# Patient Record
Sex: Female | Born: 1935 | Hispanic: No | Marital: Married | Smoking: Former smoker
Health system: Southern US, Community
[De-identification: ages and names within clinical notes are randomized; demographics above are authoritative.]

## PROBLEM LIST (undated history)

## (undated) DIAGNOSIS — K219 Gastro-esophageal reflux disease without esophagitis: Secondary | ICD-10-CM

## (undated) DIAGNOSIS — I10 Essential (primary) hypertension: Secondary | ICD-10-CM

## (undated) DIAGNOSIS — N3281 Overactive bladder: Secondary | ICD-10-CM

## (undated) DIAGNOSIS — M810 Age-related osteoporosis without current pathological fracture: Secondary | ICD-10-CM

## (undated) DIAGNOSIS — R0781 Pleurodynia: Secondary | ICD-10-CM

## (undated) DIAGNOSIS — R0789 Other chest pain: Secondary | ICD-10-CM

## (undated) DIAGNOSIS — F419 Anxiety disorder, unspecified: Secondary | ICD-10-CM

## (undated) DIAGNOSIS — G2581 Restless legs syndrome: Secondary | ICD-10-CM

## (undated) DIAGNOSIS — G43909 Migraine, unspecified, not intractable, without status migrainosus: Secondary | ICD-10-CM

## (undated) DIAGNOSIS — E538 Deficiency of other specified B group vitamins: Secondary | ICD-10-CM

## (undated) DIAGNOSIS — G4730 Sleep apnea, unspecified: Secondary | ICD-10-CM

## (undated) DIAGNOSIS — M5030 Other cervical disc degeneration, unspecified cervical region: Secondary | ICD-10-CM

## (undated) DIAGNOSIS — Z888 Allergy status to other drugs, medicaments and biological substances status: Secondary | ICD-10-CM

## (undated) DIAGNOSIS — R2681 Unsteadiness on feet: Secondary | ICD-10-CM

## (undated) DIAGNOSIS — S72009A Fracture of unspecified part of neck of unspecified femur, initial encounter for closed fracture: Secondary | ICD-10-CM

## (undated) DIAGNOSIS — R002 Palpitations: Secondary | ICD-10-CM

## (undated) HISTORY — PX: OTHER SURGICAL HISTORY: SHX169

## (undated) HISTORY — PX: APPENDECTOMY: SHX54

## (undated) HISTORY — DX: Other chest pain: R07.89

## (undated) HISTORY — DX: Pleurodynia: R07.81

## (undated) HISTORY — DX: Allergy status to other drugs, medicaments and biological substances: Z88.8

## (undated) HISTORY — DX: Gastro-esophageal reflux disease without esophagitis: K21.9

## (undated) HISTORY — PX: EXPLORATORY LAPAROTOMY: SUR591

## (undated) HISTORY — DX: Palpitations: R00.2

## (undated) HISTORY — DX: Migraine, unspecified, not intractable, without status migrainosus: G43.909

## (undated) HISTORY — DX: Sleep apnea, unspecified: G47.30

---

## 2005-04-15 ENCOUNTER — Ambulatory Visit: Payer: Self-pay | Admitting: Cardiology

## 2005-04-21 ENCOUNTER — Ambulatory Visit: Payer: Self-pay | Admitting: Cardiology

## 2005-04-28 ENCOUNTER — Ambulatory Visit: Payer: Self-pay | Admitting: Cardiology

## 2005-05-13 ENCOUNTER — Ambulatory Visit: Payer: Self-pay | Admitting: Cardiology

## 2005-06-16 ENCOUNTER — Ambulatory Visit: Payer: Self-pay | Admitting: Cardiology

## 2007-08-04 ENCOUNTER — Ambulatory Visit: Payer: Self-pay | Admitting: Cardiology

## 2010-10-22 ENCOUNTER — Other Ambulatory Visit: Payer: Self-pay

## 2011-02-27 ENCOUNTER — Encounter: Payer: Self-pay | Admitting: Cardiology

## 2011-02-27 ENCOUNTER — Ambulatory Visit (INDEPENDENT_AMBULATORY_CARE_PROVIDER_SITE_OTHER): Payer: Medicare Other | Admitting: Cardiology

## 2011-02-27 VITALS — BP 131/69 | HR 76 | Ht 60.0 in | Wt 101.0 lb

## 2011-02-27 DIAGNOSIS — K219 Gastro-esophageal reflux disease without esophagitis: Secondary | ICD-10-CM | POA: Insufficient documentation

## 2011-02-27 DIAGNOSIS — R0789 Other chest pain: Secondary | ICD-10-CM

## 2011-02-27 DIAGNOSIS — R0602 Shortness of breath: Secondary | ICD-10-CM | POA: Insufficient documentation

## 2011-02-27 DIAGNOSIS — Z888 Allergy status to other drugs, medicaments and biological substances status: Secondary | ICD-10-CM | POA: Insufficient documentation

## 2011-02-27 DIAGNOSIS — R002 Palpitations: Secondary | ICD-10-CM

## 2011-02-27 NOTE — Assessment & Plan Note (Signed)
At this point I am not convinced that her spells in the nighttime represent ischemia.  I've chosen not to proceed with any type of exercise testing at this point.

## 2011-02-27 NOTE — Patient Instructions (Signed)
Follow up as scheduled. Your physician recommends that you continue on your current medications as directed. Please refer to the Current Medication list given to you today. Your physician has requested that you have an echocardiogram. Echocardiography is a painless test that uses sound waves to create images of your heart. It provides your doctor with information about the size and shape of your heart and how well your heart's chambers and valves are working. This procedure takes approximately one hour. There are no restrictions for this procedure.

## 2011-02-27 NOTE — Assessment & Plan Note (Signed)
She is not having any significant palpitations at this time. No further workup. 

## 2011-02-27 NOTE — Progress Notes (Signed)
HPI The patient is seen to establish cardiology care.  We have received some information from her primary care office.  There is some additional information possibly at the hospital in [place] that has not been released.  However I do not believe that includes any significant cardiac information.  The patient has had palpitations over years.  She also has had some exercise testing and an echo in the past by history.  We know that the exercise test in 2011 was read as normal in [place].  The patient has exertional shortness of breath.  However she tells me that she has had pulmonary function studies and she was told that they are normal.  He does not have exertional chest pain.  She has some spells that occur in the middle of the night that include chest discomfort.  In addition there is some difficulty with moving her left arm due to pain.  She gets up and this resolves. Allergies  Allergen Reactions  . Boniva (Ibandronate Sodium)   . Codeine   . Dilaudid (Hydromorphone Hcl)   . Erythromycin   . Macrodantin   . Morphine And Related   . Requip   . Sulfa Antibiotics   . Aspirin     Stomach irritation    Current Outpatient Prescriptions  Medication Sig Dispense Refill  . ACETAMINOPHEN-BUTALBITAL 50-325 MG TABS Take 1 tablet by mouth as needed.        . clonazePAM (KLONOPIN) 2 MG tablet Take 2 mg by mouth 2 (two) times daily as needed.        . Cyanocobalamin (VITAMIN B 12 PO) Take 1 tablet by mouth daily.        . diphenoxylate-atropine (LOMOTIL) 2.5-0.025 MG per tablet Take 1 tablet by mouth 4 (four) times daily as needed.        . fish oil-omega-3 fatty acids 1000 MG capsule Take 1 g by mouth daily.        . Flaxseed, Linseed, (FLAX SEED OIL) 1000 MG CAPS Take 1 capsule by mouth daily.        . Lidocaine HCl 2 % SOLN Inhale 2 mLs into the lungs as needed.        . meperidine (DEMEROL) 50 MG tablet Take 50 mg by mouth every 4 (four) hours as needed.        . pantoprazole  (PROTONIX) 40 MG tablet Take 40 mg by mouth 2 (two) times daily.        . promethazine (PHENERGAN) 25 MG tablet Take 25 mg by mouth every 4 (four) hours as needed.        . pyridOXINE (VITAMIN B-6) 100 MG tablet Take 100 mg by mouth daily.        . traMADol (ULTRAM) 50 MG tablet Take 50 mg by mouth every 6 (six) hours as needed.        Marland Kitchen VITAMIN D, CHOLECALCIFEROL, PO Take 1 capsule by mouth daily.          History   Social History  . Marital Status: Married    Spouse Name: N/A    Number of Children: N/A  . Years of Education: N/A   Occupational History  . Not on file.   Social History Main Topics  . Smoking status: Former Smoker -- 2.0 packs/day for 7 years    Types: Cigarettes    Quit date: 09/08/1964  . Smokeless tobacco: Never Used  . Alcohol Use: Not on file  . Drug Use: Not on file  .  Sexually Active: Not on file   Other Topics Concern  . Not on file   Social History Narrative  . No narrative on file    No family history on file.  Past Medical History  Diagnosis Date  . Rib pain     March, 2012, injury from a fall  . Aspirin allergy   . Palpitations   . GERD (gastroesophageal reflux disease)   . Migraines   . History of abdominal surgery     Appendectomy, hysterectomy, exploratory surgeries.  . Chest discomfort     episodes in the middle of the night waking with some chest discomfort and difficulty moving her left arm because of pain    No past surgical history on file.  ROS  Patient denies fever, chills, headache, sweats, rash, change in vision, change in hearing, cough, nausea vomiting, urinary symptoms.  All other systems are reviewed and are negative.  PHYSICAL EXAM Patient is stable today.  She is here with her granddaughter.  She is talkative.  She is oriented to person time and place.  Affect is normal.  Head is atraumatic.  There is no xanthelasma.  There no carotid bruits.  There is no jugulovenous distention.  Lungs are clear.  Respiratory  effort is not labored.  Cardiac exam reveals S1 and S2.  No clicks or significant murmurs.  The abdomen is soft.  There is no peripheral edema.  She does have kyphosis of the spine.  There are no skin rashes. Filed Vitals:   02/27/11 1308  BP: 131/69  Pulse: 76  Height: 5' (1.524 m)  Weight: 101 lb (45.813 kg)    EKG  Done today and reviewed by me.  Is normal.  ASSESSMENT & PLAN

## 2011-02-27 NOTE — Assessment & Plan Note (Signed)
Etiology of her shortness of breath is not clear.  I am not convinced that it represents ischemia.  She reports to me that her breathing studies have been normal in the past.  We will reassess her LV function I. Two-dimensional echo to be sure that there has not been a significant change.  Historically we have reason to believe that it has been normal in the past.

## 2011-02-28 ENCOUNTER — Other Ambulatory Visit: Payer: Self-pay | Admitting: *Deleted

## 2011-02-28 DIAGNOSIS — R0602 Shortness of breath: Secondary | ICD-10-CM

## 2011-02-28 DIAGNOSIS — R002 Palpitations: Secondary | ICD-10-CM

## 2011-02-28 DIAGNOSIS — R0789 Other chest pain: Secondary | ICD-10-CM

## 2011-03-13 ENCOUNTER — Other Ambulatory Visit: Payer: Self-pay | Admitting: *Deleted

## 2011-03-13 ENCOUNTER — Other Ambulatory Visit (INDEPENDENT_AMBULATORY_CARE_PROVIDER_SITE_OTHER): Payer: Medicare Other | Admitting: *Deleted

## 2011-03-13 ENCOUNTER — Other Ambulatory Visit: Payer: Self-pay | Admitting: Cardiology

## 2011-03-13 DIAGNOSIS — R0602 Shortness of breath: Secondary | ICD-10-CM

## 2011-03-13 DIAGNOSIS — I369 Nonrheumatic tricuspid valve disorder, unspecified: Secondary | ICD-10-CM

## 2011-03-13 DIAGNOSIS — R0789 Other chest pain: Secondary | ICD-10-CM

## 2011-03-13 DIAGNOSIS — R002 Palpitations: Secondary | ICD-10-CM

## 2011-03-13 DIAGNOSIS — I379 Nonrheumatic pulmonary valve disorder, unspecified: Secondary | ICD-10-CM

## 2011-03-20 ENCOUNTER — Encounter: Payer: Self-pay | Admitting: Cardiology

## 2011-03-20 DIAGNOSIS — R9430 Abnormal result of cardiovascular function study, unspecified: Secondary | ICD-10-CM | POA: Insufficient documentation

## 2011-03-21 ENCOUNTER — Ambulatory Visit (INDEPENDENT_AMBULATORY_CARE_PROVIDER_SITE_OTHER): Payer: Medicare Other | Admitting: Cardiology

## 2011-03-21 ENCOUNTER — Encounter: Payer: Self-pay | Admitting: Cardiology

## 2011-03-21 DIAGNOSIS — R002 Palpitations: Secondary | ICD-10-CM

## 2011-03-21 DIAGNOSIS — R0789 Other chest pain: Secondary | ICD-10-CM

## 2011-03-21 NOTE — Assessment & Plan Note (Signed)
At this point there is no evidence of significant arrhythmias.  No further workup.

## 2011-03-21 NOTE — Patient Instructions (Signed)
Your physician wants you to follow-up in: 6 months. You will receive a reminder letter in the mail one-two months in advance. If you don't receive a letter, please call our office to schedule the follow-up appointment. Your physician recommends that you continue on your current medications as directed. Please refer to the Current Medication list given to you today. 

## 2011-03-21 NOTE — Progress Notes (Signed)
HPI Patient is seen for followup chest pain and palpitations.  I saw her last February 27, 2011.  Decision at that time was made to reassure her and proceed with a 2-D echo.  The study was done and reveals an ejection fraction of 55%.  There no focal wall motion abnormalities.  There is no significant valvular abnormalities.  She has been stable.  Her husband was ill and she has been stressed by this. Allergies  Allergen Reactions  . Boniva (Ibandronate Sodium)   . Codeine   . Dilaudid (Hydromorphone Hcl)   . Erythromycin   . Macrodantin   . Morphine And Related   . Requip   . Sulfa Antibiotics   . Aspirin     Stomach irritation    Current Outpatient Prescriptions  Medication Sig Dispense Refill  . ACETAMINOPHEN-BUTALBITAL 50-325 MG TABS Take 1 tablet by mouth as needed.        . clonazePAM (KLONOPIN) 2 MG tablet Take 2 mg by mouth 2 (two) times daily as needed.        . Cyanocobalamin (VITAMIN B 12 PO) Take 1 tablet by mouth daily.        . diphenoxylate-atropine (LOMOTIL) 2.5-0.025 MG per tablet Take 1 tablet by mouth 4 (four) times daily as needed.        . fish oil-omega-3 fatty acids 1000 MG capsule Take 1 g by mouth daily.        . Flaxseed, Linseed, (FLAX SEED OIL) 1000 MG CAPS Take 1 capsule by mouth daily.        . Lidocaine HCl 2 % SOLN Inhale 2 mLs into the lungs as needed.        . meperidine (DEMEROL) 50 MG tablet Take 50 mg by mouth every 4 (four) hours as needed.        . pantoprazole (PROTONIX) 40 MG tablet Take 40 mg by mouth 2 (two) times daily.        . promethazine (PHENERGAN) 25 MG tablet Take 25 mg by mouth every 4 (four) hours as needed.        . pyridOXINE (VITAMIN B-6) 100 MG tablet Take 100 mg by mouth daily.        . traMADol (ULTRAM) 50 MG tablet Take 50 mg by mouth every 6 (six) hours as needed.        Marland Kitchen VITAMIN D, CHOLECALCIFEROL, PO Take 1 capsule by mouth daily.          History   Social History  . Marital Status: Married    Spouse Name: N/A    Number  of Children: N/A  . Years of Education: N/A   Occupational History  . Not on file.   Social History Main Topics  . Smoking status: Former Smoker -- 2.0 packs/day for 7 years    Types: Cigarettes    Quit date: 09/08/1964  . Smokeless tobacco: Never Used  . Alcohol Use: Not on file  . Drug Use: Not on file  . Sexually Active: Not on file   Other Topics Concern  . Not on file   Social History Narrative  . No narrative on file    No family history on file.  Past Medical History  Diagnosis Date  . Rib pain     March, 2012, injury from a fall  . Aspirin allergy   . Palpitations   . GERD (gastroesophageal reflux disease)   . Migraines   . History of abdominal surgery  Appendectomy, hysterectomy, exploratory surgeries.  . Chest discomfort     episodes in the middle of the night waking with some chest discomfort and difficulty moving her left arm because of pain  . Shortness of breath     patient reports that pulmonary function studies were good in the past.  . Ejection fraction     EF 55%,echo,03/13/2011    No past surgical history on file.  ROS  Patient denies fever, chills, headache, sweats, rash, change in vision, change in hearing, chest pain, cough, nausea vomiting, urinary symptoms.  All other systems are reviewed and are negative.  PHYSICAL EXAM Patient is stable today.  She is here with a family member.  She is anxious as always.  He is oriented to person time and place.  Affect is normal.  Head is atraumatic.  Lungs are clear.  Respiratory effort is nonlabored.  Cardiac exam reveals S1-S2.  There is a very soft systolic murmur.  The abdomen is soft.  There is no peripheral edema.   ASSESSMENT & PLAN

## 2011-03-21 NOTE — Assessment & Plan Note (Signed)
Left ventricular function is normal.  I feel the patient does not exercise testing at this time.  Reassurance.

## 2012-01-14 ENCOUNTER — Encounter: Payer: Self-pay | Admitting: Cardiology

## 2012-01-14 ENCOUNTER — Ambulatory Visit (INDEPENDENT_AMBULATORY_CARE_PROVIDER_SITE_OTHER): Payer: Medicare Other | Admitting: Cardiology

## 2012-01-14 DIAGNOSIS — G4730 Sleep apnea, unspecified: Secondary | ICD-10-CM

## 2012-01-14 DIAGNOSIS — R0789 Other chest pain: Secondary | ICD-10-CM

## 2012-01-14 DIAGNOSIS — R079 Chest pain, unspecified: Secondary | ICD-10-CM

## 2012-01-14 DIAGNOSIS — R002 Palpitations: Secondary | ICD-10-CM

## 2012-01-14 MED ORDER — NITROGLYCERIN 0.4 MG SL SUBL: 0.4000 mg | SUBLINGUAL_TABLET | SUBLINGUAL | Status: DC | PRN

## 2012-01-14 NOTE — Progress Notes (Signed)
HPI  Patient is here to followup chest pain and palpitations. She is stable. She did fall and injure her pelvis. She did not have syncope. This event occurred while ago when she is stabilized. She has some shooting chest pain at night. This does not sound cardiac in origin. She had a skin lesion removed from her scalp and is awaiting the biopsy. Also it appears that she may well have sleep apnea.  Allergies  Allergen Reactions  . Boniva (Ibandronate Sodium)   . Codeine   . Darvocet (Propoxyphene-Acetaminophen)   . Dilaudid (Hydromorphone Hcl)   . Erythromycin   . Macrodantin   . Morphine And Related   . Ropinirole Hcl   . Sulfa Antibiotics   . Aspirin     Stomach irritation    Current Outpatient Prescriptions  Medication Sig Dispense Refill  . b complex vitamins tablet Take 1 tablet by mouth daily.      . cetirizine (ZYRTEC) 10 MG tablet Take 10 mg by mouth daily.      . clonazePAM (KLONOPIN) 2 MG tablet Take 2 mg by mouth 2 (two) times daily.       . fish oil-omega-3 fatty acids 1000 MG capsule Take 1 g by mouth daily.        . Flaxseed, Linseed, (FLAX SEED OIL) 1000 MG CAPS Take 1 capsule by mouth daily.        . Lidocaine HCl 2 % SOLN Inhale 2 mLs into the lungs as needed.        . meperidine (DEMEROL) 50 MG tablet Take 50 mg by mouth every 4 (four) hours as needed.        . pantoprazole (PROTONIX) 40 MG tablet Take 40 mg by mouth 2 (two) times daily.        . promethazine (PHENERGAN) 25 MG tablet Take 25 mg by mouth every 4 (four) hours as needed.        Marland Kitchen VITAMIN D, CHOLECALCIFEROL, PO Take 1 capsule by mouth daily.          History   Social History  . Marital Status: Married    Spouse Name: N/A    Number of Children: N/A  . Years of Education: N/A   Occupational History  . Not on file.   Social History Main Topics  . Smoking status: Former Smoker -- 2.0 packs/day for 7 years    Types: Cigarettes    Quit date: 09/08/1964  . Smokeless tobacco: Never Used  .  Alcohol Use: Not on file  . Drug Use: Not on file  . Sexually Active: Not on file   Other Topics Concern  . Not on file   Social History Narrative  . No narrative on file    No family history on file.  Past Medical History  Diagnosis Date  . Rib pain     March, 2012, injury from a fall  . Aspirin allergy   . Palpitations   . GERD (gastroesophageal reflux disease)   . Migraines   . History of abdominal surgery     Appendectomy, hysterectomy, exploratory surgeries.  . Chest discomfort     episodes in the middle of the night waking with some chest discomfort and difficulty moving her left arm because of pain  . Shortness of breath     patient reports that pulmonary function studies were good in the past.  . Ejection fraction     EF 55%,echo,03/13/2011    No past surgical history on  file.  ROS  Patient denies fever, chills, headache, sweats, rash, change in vision, change in hearing, cough, nausea vomiting, urinary symptoms. All other systems are reviewed and are negative.  PHYSICAL EXAM  Patient is oriented to person time and place. Affect is normal. She is very talkative as always. There is no jugular venous distention. The area of biopsy on her scalp is healing. Lungs reveal a few scattered rhonchi. There is no respiratory distress. Cardiac exam reveals S1 and S2. There no clicks or significant murmurs. The abdomen is soft. Is no peripheral edema.  Filed Vitals:   01/14/12 1324  BP: 110/65  Pulse: 87  Height: 5\' 2"  (1.575 m)  Weight: 95 lb (43.092 kg)     ASSESSMENT & PLAN

## 2012-01-14 NOTE — Patient Instructions (Signed)
Your physician you to follow up in 1 year. You will receive a reminder letter in the mail one-two months in advance. If you don't receive a letter, please call our office to schedule the follow-up appointment. Your physician recommends that you continue on your current medications as directed. Please refer to the Current Medication list given to you today. 

## 2012-01-14 NOTE — Assessment & Plan Note (Signed)
The patient gives a history of what sounds like possible sleep apnea. She will discuss this with her primary physician.

## 2012-01-14 NOTE — Assessment & Plan Note (Signed)
She's not having any marked palpitations. No further workup.

## 2012-01-14 NOTE — Assessment & Plan Note (Signed)
Her chest discomfort continues to be in frequent and atypical. I believe this is not cardiac. No further workup.

## 2013-01-20 ENCOUNTER — Ambulatory Visit: Payer: Medicare Other | Admitting: Cardiology

## 2013-02-23 ENCOUNTER — Ambulatory Visit (INDEPENDENT_AMBULATORY_CARE_PROVIDER_SITE_OTHER): Payer: Medicare Other | Admitting: Cardiology

## 2013-02-23 ENCOUNTER — Encounter: Payer: Self-pay | Admitting: Cardiology

## 2013-02-23 VITALS — BP 128/73 | HR 80 | Ht 62.0 in | Wt 103.0 lb

## 2013-02-23 DIAGNOSIS — R0789 Other chest pain: Secondary | ICD-10-CM

## 2013-02-23 DIAGNOSIS — R0602 Shortness of breath: Secondary | ICD-10-CM

## 2013-02-23 DIAGNOSIS — R002 Palpitations: Secondary | ICD-10-CM

## 2013-02-23 NOTE — Patient Instructions (Addendum)

## 2013-02-23 NOTE — Assessment & Plan Note (Signed)
There are very rare and significant palpitations. No further workup.

## 2013-02-23 NOTE — Progress Notes (Signed)
HPI  Patient is seen in one year followup to followup her overall cardiac status. She is doing fine. She is not having any recurrent chest pain. She has very rare very limited palpitations. These are not causing her any significant problems.  Allergies  Allergen Reactions  . Boniva (Ibandronate Sodium)   . Codeine   . Darvocet (Propoxyphene-Acetaminophen)   . Dilaudid (Hydromorphone Hcl)   . Erythromycin   . Macrodantin   . Morphine And Related   . Ropinirole Hcl   . Sulfa Antibiotics   . Aspirin     Stomach irritation    Current Outpatient Prescriptions  Medication Sig Dispense Refill  . clonazePAM (KLONOPIN) 2 MG tablet Take 2 mg by mouth 2 (two) times daily.       . Cyanocobalamin (B-12 PO) Take 1 tablet by mouth daily.      . fish oil-omega-3 fatty acids 1000 MG capsule Take 1 g by mouth daily.        . Flaxseed, Linseed, (FLAX SEED OIL) 1000 MG CAPS Take 1 capsule by mouth daily.        . Lidocaine HCl 2 % SOLN Inhale 2 mLs into the lungs as needed.        . meperidine (DEMEROL) 50 MG tablet Take 50 mg by mouth every 4 (four) hours as needed.        . nitroGLYCERIN (NITROSTAT) 0.4 MG SL tablet Place 0.4 mg under the tongue every 5 (five) minutes as needed.      . pantoprazole (PROTONIX) 40 MG tablet Take 40 mg by mouth 2 (two) times daily.        . promethazine (PHENERGAN) 25 MG tablet Take 25 mg by mouth every 4 (four) hours as needed.        . Pyridoxine HCl (B-6 PO) Take 1 tablet by mouth daily.      Marland Kitchen VITAMIN D, CHOLECALCIFEROL, PO Take 1 capsule by mouth daily.         No current facility-administered medications for this visit.    History   Social History  . Marital Status: Married    Spouse Name: N/A    Number of Children: N/A  . Years of Education: N/A   Occupational History  . Not on file.   Social History Main Topics  . Smoking status: Former Smoker -- 2.00 packs/day for 7 years    Types: Cigarettes    Quit date: 09/08/1964  . Smokeless tobacco:  Never Used  . Alcohol Use: Not on file  . Drug Use: Not on file  . Sexually Active: Not on file   Other Topics Concern  . Not on file   Social History Narrative  . No narrative on file    No family history on file.  Past Medical History  Diagnosis Date  . Rib pain     March, 2012, injury from a fall  . Aspirin allergy   . Palpitations   . GERD (gastroesophageal reflux disease)   . Migraines   . History of abdominal surgery     Appendectomy, hysterectomy, exploratory surgeries.  . Chest discomfort     episodes in the middle of the night waking with some chest discomfort and difficulty moving her left arm because of pain  . Shortness of breath     patient reports that pulmonary function studies were good in the past.  . Ejection fraction     EF 55%,echo,03/13/2011  . Sleep apnea     History  consistent with sleep apnea. The patient will discuss with her primary physician.    No past surgical history on file.  Patient Active Problem List   Diagnosis Date Noted  . Sleep apnea   . Ejection fraction   . Aspirin allergy   . Palpitations   . GERD (gastroesophageal reflux disease)   . Chest discomfort   . Shortness of breath     ROS   Patient denies fever, chills, headache, sweats, rash, change in vision, change in hearing, chest pain, cough, nausea vomiting, urinary symptoms. All other systems are reviewed and are negative.  PHYSICAL EXAM   Patient is oriented to person time and place. Affect is normal. She's here with her husband. There is no jugulovenous distention. Lungs are clear. Respiratory effort is nonlabored. Cardiac exam reveals S1 and S2. There no clicks or significant murmurs. The abdomen is soft. There is no peripheral edema.  Filed Vitals:   02/23/13 1304  BP: 128/73  Pulse: 80  Height: 5\' 2"  (1.575 m)  Weight: 103 lb (46.72 kg)   EKG is done today and reviewed by me. There is normal sinus rhythm. There are nonspecific ST-T wave changes. There is no  significant change.  ASSESSMENT & PLAN

## 2013-02-23 NOTE — Assessment & Plan Note (Signed)
She's not having any significant chest pain. No further workup. 

## 2013-02-23 NOTE — Assessment & Plan Note (Signed)
No significant shortness of breath. No further workup. 

## 2014-02-10 ENCOUNTER — Encounter: Payer: Self-pay | Admitting: Cardiology

## 2014-02-10 ENCOUNTER — Ambulatory Visit (INDEPENDENT_AMBULATORY_CARE_PROVIDER_SITE_OTHER): Payer: Medicare Other | Admitting: Cardiology

## 2014-02-10 VITALS — BP 132/92 | HR 69 | Ht 60.0 in | Wt 107.8 lb

## 2014-02-10 DIAGNOSIS — R002 Palpitations: Secondary | ICD-10-CM

## 2014-02-10 DIAGNOSIS — R0789 Other chest pain: Secondary | ICD-10-CM

## 2014-02-10 MED ORDER — NITROGLYCERIN 0.3 MG SL SUBL: 0.3000 mg | SUBLINGUAL_TABLET | SUBLINGUAL | Status: DC | PRN

## 2014-02-10 NOTE — Patient Instructions (Signed)
   Nitroglycerin 0.3mg  tablet - place under tongue as needed for severe chest pain only - new sent to pharm Continue all other medications.   Your physician wants you to follow up in:  1 year.  You will receive a reminder letter in the mail one-two months in advance.  If you don't receive a letter, please call our office to schedule the follow up appointment

## 2014-02-10 NOTE — Assessment & Plan Note (Signed)
She has rare vague discomfort under her left breast. She does not need further workup. I have given her permission to use a nitroglycerin if she feels she needs to on a very limited basis. She knows she should be sitting or lying if she takes the pill.

## 2014-02-10 NOTE — Assessment & Plan Note (Signed)
She has not been having any significant palpitations. No further workup. 

## 2014-02-10 NOTE — Progress Notes (Signed)
Patient ID: Crystal Lopez, female   DOB: [birth year], 79 y.o.   MRN: [id number]    HPI  Patient is seen for a one-year followup. She has a history of some palpitations. She does not have any documented significant cardiac disease. She feels well. She is remaining active. She describes a sensation of needing to take a deep breath sometimes when she stands up. This seems to not represent any significant problem. She also has some vague discomfort under her left breast that is quite rare. She might take a nitroglycerin for this once or twice per year. This seems to make her feel comfortable to follow this approach. I made it clear to her that she must be sitting or lying if she uses the nitroglycerin.  Allergies  Allergen Reactions  . Boniva [Ibandronate Sodium]   . Codeine   . Darvocet [Propoxyphene N-Acetaminophen]   . Dilaudid [Hydromorphone Hcl]   . Erythromycin   . Macrodantin   . Morphine And Related   . Ropinirole Hcl   . Sulfa Antibiotics   . Aspirin     Stomach irritation    Current Outpatient Prescriptions  Medication Sig Dispense Refill  . clonazePAM (KLONOPIN) 2 MG tablet Take 2 mg by mouth 2 (two) times daily.       . Cyanocobalamin (B-12 PO) Take 1 tablet by mouth daily.      . fish oil-omega-3 fatty acids 1000 MG capsule Take 1 g by mouth daily.        . Flaxseed, Linseed, (FLAX SEED OIL) 1000 MG CAPS Take 1 capsule by mouth daily.        . Lidocaine HCl 2 % SOLN Inhale 2 mLs into the lungs as needed.        . meperidine (DEMEROL) 50 MG tablet Take 50 mg by mouth every 4 (four) hours as needed.        . nitroGLYCERIN (NITROSTAT) 0.4 MG SL tablet Place 0.4 mg under the tongue every 5 (five) minutes as needed.      . pantoprazole (PROTONIX) 40 MG tablet Take 40 mg by mouth 2 (two) times daily.        . promethazine (PHENERGAN) 25 MG tablet Take 25 mg by mouth every 4 (four) hours as needed.        . Pyridoxine HCl (B-6 PO) Take 1 tablet by mouth daily.      Marland Kitchen VITAMIN D,  CHOLECALCIFEROL, PO Take 1 capsule by mouth daily.         No current facility-administered medications for this visit.    History   Social History  . Marital Status: Married    Spouse Name: N/A    Number of Children: N/A  . Years of Education: N/A   Occupational History  . Not on file.   Social History Main Topics  . Smoking status: Former Smoker -- 2.00 packs/day for 7 years    Types: Cigarettes    Quit date: 09/08/1964  . Smokeless tobacco: Never Used  . Alcohol Use: Not on file  . Drug Use: Not on file  . Sexual Activity: Not on file   Other Topics Concern  . Not on file   Social History Narrative  . No narrative on file    No family history on file.  Past Medical History  Diagnosis Date  . Rib pain     March, 2012, injury from a fall  . Aspirin allergy   . Palpitations   . GERD (gastroesophageal reflux disease)   .  Migraines   . History of abdominal surgery     Appendectomy, hysterectomy, exploratory surgeries.  . Chest discomfort     episodes in the middle of the night waking with some chest discomfort and difficulty moving her left arm because of pain  . Shortness of breath     patient reports that pulmonary function studies were good in the past.  . Ejection fraction     EF 55%,echo,03/13/2011  . Sleep apnea     History consistent with sleep apnea. The patient will discuss with her primary physician.    History reviewed. No pertinent past surgical history.  Patient Active Problem List   Diagnosis Date Noted  . Sleep apnea   . Ejection fraction   . Aspirin allergy   . Palpitations   . GERD (gastroesophageal reflux disease)   . Chest discomfort   . Shortness of breath     ROS   Patient denies fever, chills, headache, sweats, rash. She has had some difficulty with her eyes. She may need surgery. She's had no change in hearing. She denies cough, urinary symptoms or GI symptoms. All other systems are reviewed and are negative.  PHYSICAL EXAM   Patient is oriented to person time and place. She is here with her husband of 61 years. Head is atraumatic. Sclera and conjunctiva are normal. There is no jugulovenous distention. Lungs are clear. Respiratory effort is nonlabored. Cardiac exam reveals S1 and S2. There no clicks or significant murmurs. The abdomen is soft. There is no peripheral edema. There no musculoskeletal deformities. There are no skin rashes.  Filed Vitals:   02/10/14 0944  BP: 132/92  Pulse: 69  Height: 5' (1.524 m)  Weight: 107 lb 12.8 oz (48.898 kg)   EKG is done today and reviewed by me. There is normal sinus rhythm. There are mild nonspecific ST changes. There is no change from the past. ASSESSMENT & PLAN

## 2014-09-04 ENCOUNTER — Telehealth: Payer: Self-pay | Admitting: Cardiology

## 2014-09-04 NOTE — Telephone Encounter (Signed)
Patient called wanting to know if she needs to be seen for a recent fall and feeling not right with heart she stated in message that she left on voice mail.  Said that she would like to be seen by Dr Diona BrownerMcDowell if possible since Dr Myrtis SerKatz is retiring.  [phone]

## 2014-09-05 ENCOUNTER — Ambulatory Visit (INDEPENDENT_AMBULATORY_CARE_PROVIDER_SITE_OTHER): Payer: Medicare Other | Admitting: Internal Medicine

## 2014-09-05 ENCOUNTER — Encounter: Payer: Self-pay | Admitting: Internal Medicine

## 2014-09-05 VITALS — BP 138/76 | HR 80 | Ht 60.0 in | Wt 102.6 lb

## 2014-09-05 DIAGNOSIS — R0789 Other chest pain: Secondary | ICD-10-CM

## 2014-09-05 LAB — CBC
HCT: 30.6 % — ABNORMAL LOW (ref 36.0–46.0)
HEMOGLOBIN: 10.4 g/dL — AB (ref 12.0–15.0)
MCH: 27.1 pg (ref 26.0–34.0)
MCHC: 34 g/dL (ref 30.0–36.0)
MCV: 79.7 fL (ref 78.0–100.0)
MPV: 9.8 fL (ref 8.6–12.4)
PLATELETS: 279 10*3/uL (ref 150–400)
RBC: 3.84 MIL/uL — AB (ref 3.87–5.11)
RDW: 13.7 % (ref 11.5–15.5)
WBC: 5.9 10*3/uL (ref 4.0–10.5)

## 2014-09-05 LAB — BASIC METABOLIC PANEL
BUN: 5 mg/dL — AB (ref 6–23)
CO2: 33 mEq/L — ABNORMAL HIGH (ref 19–32)
CREATININE: 0.88 mg/dL (ref 0.50–1.10)
Calcium: 9.6 mg/dL (ref 8.4–10.5)
Chloride: 100 mEq/L (ref 96–112)
Glucose, Bld: 102 mg/dL — ABNORMAL HIGH (ref 70–99)
Potassium: 4.1 mEq/L (ref 3.5–5.3)
Sodium: 137 mEq/L (ref 135–145)

## 2014-09-05 NOTE — Progress Notes (Signed)
HPI Patinet is a 79 yo who presents for evaluation of CP The patinet usually follows up with Lovena NeighboursJ Katz  She was seen earlier this year.  She has a history of palpititions as well as CP in the past The patient fell about 3 t o4 wk ago.  No defiinite fx  Waiting on approval for MIR Since then has had episodes of epigastric and L sided CP  Pain starts in epigastrum goes L along ribs to side and up to shoulder.  Does note some nausea  Can relieve with burp.  Has occurred every night this week  No SOB  Not very active since fall.  NOt aware of how breathing would effect  Did not try that.   Overall spells last about 4 min.    Allergies  Allergen Reactions  . Boniva [Ibandronate Sodium]   . Codeine   . Darvocet [Propoxyphene N-Acetaminophen]   . Dilaudid [Hydromorphone Hcl]   . Erythromycin   . Macrodantin   . Morphine And Related   . Ropinirole Hcl   . Sulfa Antibiotics   . Aspirin     Stomach irritation    Current Outpatient Prescriptions  Medication Sig Dispense Refill  . clonazePAM (KLONOPIN) 2 MG tablet Take 2 mg by mouth 2 (two) times daily.     . Cyanocobalamin (B-12 PO) Take 1 tablet by mouth daily.    . fish oil-omega-3 fatty acids 1000 MG capsule Take 1 g by mouth daily.      . Flaxseed, Linseed, (FLAX SEED OIL) 1000 MG CAPS Take 1 capsule by mouth daily.      . Lidocaine HCl 2 % SOLN Inhale 2 mLs into the lungs as needed.      . meperidine (DEMEROL) 50 MG tablet Take 50 mg by mouth every 4 (four) hours as needed.      . nitroGLYCERIN (NITROSTAT) 0.3 MG SL tablet Place 1 tablet (0.3 mg total) under the tongue every 5 (five) minutes as needed for chest pain. 25 tablet 3  . pantoprazole (PROTONIX) 40 MG tablet Take 40 mg by mouth 2 (two) times daily.      . promethazine (PHENERGAN) 25 MG tablet Take 25 mg by mouth every 4 (four) hours as needed.      . Pyridoxine HCl (B-6 PO) Take 1 tablet by mouth daily.    Marland Kitchen. VITAMIN D, CHOLECALCIFEROL, PO Take 1 capsule by mouth daily.       No  current facility-administered medications for this visit.    Past Medical History  Diagnosis Date  . Rib pain     March, 2012, injury from a fall  . Aspirin allergy   . Palpitations   . GERD (gastroesophageal reflux disease)   . Migraines   . History of abdominal surgery     Appendectomy, hysterectomy, exploratory surgeries.  . Chest discomfort     episodes in the middle of the night waking with some chest discomfort and difficulty moving her left arm because of pain  . Shortness of breath     patient reports that pulmonary function studies were good in the past.  . Ejection fraction     EF 55%,echo,03/13/2011  . Sleep apnea     History consistent with sleep apnea. The patient will discuss with her primary physician.    No past surgical history on file.  No family history on file.  History   Social History  . Marital Status: Married    Spouse Name: N/A  Number of Children: N/A  . Years of Education: N/A   Occupational History  . Not on file.   Social History Main Topics  . Smoking status: Former Smoker -- 2.00 packs/day for 7 years    Types: Cigarettes    Quit date: 09/08/1964  . Smokeless tobacco: Never Used  . Alcohol Use: Not on file  . Drug Use: Not on file  . Sexual Activity: Not on file   Other Topics Concern  . Not on file   Social History Narrative    Review of Systems:  All systems reviewed.  They are negative to the above problem except as previously stated.  Vital Signs: BP 138/76 mmHg  Pulse 80  Ht 5' (1.524 m)  Wt 102 lb 9.6 oz (46.539 kg)  BMI 20.04 kg/m2  Physical Exam  HEENT:  Normocephalic, atraumatic. EOMI, PERRLA.  Neck: JVP is normal.  No bruits.  Lungs: clear to auscultation. No rales no wheezes.  Heart: Regular rate and rhythm. Normal S1, S2. No S3.   No significant murmurs. PMI not displaced. Chest:  Tender along lower ribs on L side and in epigastrum    Abdomen:  Supple, nontender. Normal bowel sounds. No masses. No  hepatomegaly.  Extremities:   Good distal pulses throughout. No lower extremity edema.  Musculoskeletal :moving all extremities.  Neuro:   alert and oriented x3.  CN II-XII grossly intact.  EKG  SR 78 bpm   Assessment and Plan:  1.  CP  Atypical for cardiac  Appears more musculoskeletal  I have tried t oreassure her  She is worried because family memebers had CAD Would continue current regimen  Needs time/rest.   F/U with Lovena NeighboursJ Katz  2.  Palpiations.  Has intermittently.  Follow

## 2014-09-05 NOTE — Patient Instructions (Signed)
Your physician recommends that you schedule a follow-up appointment in: 2 months with Dr. Myrtis SerKatz  Your physician recommends that you continue on your current medications as directed. Please refer to the Current Medication list given to you today.  Your physician recommends that you return for lab work  CBC/ BMP  Thank you for choosing [facility]!

## 2014-09-05 NOTE — Telephone Encounter (Addendum)
Spoke with patient yesterday regarding issue below.  Stated that she fell approximately 3-4 weeks ago.  Fell on left side & buttock.  Has seen her PMD and had x-rays.  Physical therapy saw her yesterday as well.  I spoke with therapist.  Therapist stated that patient was having a lot of pain related to her hip & could not really get comfortable.  BP - 128/75  88  97%   Patient stated that she now has had chest pain/pressure x last 4-5 nights.  Seems to happen mostly at night when she is in bed.  Pain comes up around chest area, around to back side & into left arm.  Stated that she does have palpitations and irregular heart beat.   States that her pain is very hard to describe.    Office visit scheduled for today at 1:20 with Dr. Tenny Crawoss in [place].    Patient notified and verbalized understanding.

## 2014-09-27 ENCOUNTER — Telehealth: Payer: Self-pay | Admitting: Cardiology

## 2014-09-27 NOTE — Telephone Encounter (Signed)
Lab work requested and awaiting results to be faxed to the office.

## 2014-09-27 NOTE — Telephone Encounter (Signed)
Notes Recorded by Pricilla RifflePaula Ross V, MD on 09/07/2014 at 3:07 PM Electrolytes OK Patient is mildly anemic Forward to [place] office Don't have others to compare. No new recommendations.

## 2014-09-27 NOTE — Telephone Encounter (Signed)
Nurse advised patient that [facility] lab would be contacted for results.

## 2014-09-27 NOTE — Telephone Encounter (Signed)
Wanting to results from recent lab work

## 2014-09-27 NOTE — Telephone Encounter (Signed)
Patient informed. 

## 2014-11-09 ENCOUNTER — Ambulatory Visit (INDEPENDENT_AMBULATORY_CARE_PROVIDER_SITE_OTHER): Payer: Medicare Other | Admitting: Cardiology

## 2014-11-09 ENCOUNTER — Encounter: Payer: Self-pay | Admitting: Cardiology

## 2014-11-09 VITALS — BP 147/73 | HR 74 | Ht 60.0 in | Wt 107.0 lb

## 2014-11-09 DIAGNOSIS — R002 Palpitations: Secondary | ICD-10-CM

## 2014-11-09 DIAGNOSIS — R0789 Other chest pain: Secondary | ICD-10-CM

## 2014-11-09 NOTE — Progress Notes (Signed)
Cardiology Office Note   Date:  11/09/2014   ID:  Britta Louth, DOB 10/11/1934, MRN [id number]  PCP:  Bedelia Person, MD  Cardiologist:  Willa Rough, MD   Chief Complaint  Patient presents with  . Appointment    Follow-up chest pain      History of Present Illness: Crystal Lopez is a 80 y.o. female who presents today to follow-up some chest pain and palpitations. I had seen her last in June, 2015. In December of 2015 she fell out of bed. She was bothered by some chest pain and she was seen by Dr. Tenny Craw and the [place] office. It was felt that her cardiac status was stable and that she could follow-up with me. Today she is stable and she's not having any significant chest pain. She has very rare palpitations.    Past Medical History  Diagnosis Date  . Rib pain     March, 2012, injury from a fall  . Aspirin allergy   . Palpitations   . GERD (gastroesophageal reflux disease)   . Migraines   . History of abdominal surgery     Appendectomy, hysterectomy, exploratory surgeries.  . Chest discomfort     episodes in the middle of the night waking with some chest discomfort and difficulty moving her left arm because of pain  . Shortness of breath     patient reports that pulmonary function studies were good in the past.  . Ejection fraction     EF 55%,echo,03/13/2011  . Sleep apnea     History consistent with sleep apnea. The patient will discuss with her primary physician.    History reviewed. No pertinent past surgical history.  Patient Active Problem List   Diagnosis Date Noted  . Sleep apnea   . Ejection fraction   . Aspirin allergy   . Palpitations   . GERD (gastroesophageal reflux disease)   . Chest discomfort   . Shortness of breath       Current Outpatient Prescriptions  Medication Sig Dispense Refill  . cetirizine (ZYRTEC) 10 MG tablet Take 10 mg by mouth daily.    . clonazePAM (KLONOPIN) 2 MG tablet Take 2 mg by mouth 2 (two) times daily.     .  Cyanocobalamin (B-12 PO) Take 1 tablet by mouth daily.    . fish oil-omega-3 fatty acids 1000 MG capsule Take 1 g by mouth daily.      . Flaxseed, Linseed, (FLAX SEED OIL) 1000 MG CAPS Take 1 capsule by mouth daily.      . Lidocaine HCl 2 % SOLN Inhale 2 mLs into the lungs as needed.      . meperidine (DEMEROL) 50 MG tablet Take 50 mg by mouth every 4 (four) hours as needed.      . nitroGLYCERIN (NITROSTAT) 0.3 MG SL tablet Place 1 tablet (0.3 mg total) under the tongue every 5 (five) minutes as needed for chest pain. 25 tablet 3  . pantoprazole (PROTONIX) 40 MG tablet Take 40 mg by mouth 2 (two) times daily.      . promethazine (PHENERGAN) 25 MG tablet Take 25 mg by mouth every 4 (four) hours as needed.      Marland Kitchen VITAMIN D, CHOLECALCIFEROL, PO Take 1 capsule by mouth daily.       No current facility-administered medications for this visit.    Allergies:   Boniva; Codeine; Darvocet; Dilaudid; Erythromycin; Macrodantin; Morphine and related; Ropinirole hcl; Sulfa antibiotics; and Aspirin    Social History:  The patient  reports that she quit smoking about 50 years ago. Her smoking use included Cigarettes. She has a 14 pack-year smoking history. She has never used smokeless tobacco.   Family History:  There is no significant family history of coronary disease.   ROS:  Please see the history of present illness.     Patient denies fever, chills, headache, sweats, rash, change in vision, change in hearing, chest pain, cough, nausea or vomiting, urinary symptoms. All other systems are reviewed and are negative.  PHYSICAL EXAM: VS:  BP 147/73 mmHg  Pulse 74  Ht 5' (1.524 m)  Wt 107 lb (48.535 kg)  BMI 20.90 kg/m2  SpO2 97% , Patient is oriented to person time and place. Affect is normal. She is here with a family member. Head is atraumatic. Sclera and conjunctiva are normal. There is no jugulovenous distention. Lungs are clear. Respiratory effort is not labored. Cardiac exam reveals S1 and S2.  The rhythm is regular. Abdomen is soft and there is no peripheral edema. There are no musculoskeletal deformities. There are no skin rashes.  EKG:   EKG is not done today.   Recent Labs: 09/05/2014: BUN 5*; Creatinine 0.88; Hemoglobin 10.4*; Platelets 279; Potassium 4.1; Sodium 137    Lipid Panel No results found for: CHOL, TRIG, HDL, CHOLHDL, VLDL, LDLCALC, LDLDIRECT    Wt Readings from Last 3 Encounters:  11/09/14 107 lb (48.535 kg)  09/05/14 102 lb 9.6 oz (46.539 kg)  02/10/14 107 lb 12.8 oz (48.898 kg)      Current medicines are reviewed  Patient understands her medications.     ASSESSMENT AND PLAN:

## 2014-11-09 NOTE — Assessment & Plan Note (Signed)
She has very rare palpitations. She does not have significant arrhythmias. No further workup.

## 2014-11-09 NOTE — Patient Instructions (Signed)

## 2014-11-09 NOTE — Assessment & Plan Note (Signed)
She is not having any significant recurrent chest discomfort. No further workup.

## 2015-02-09 ENCOUNTER — Ambulatory Visit (INDEPENDENT_AMBULATORY_CARE_PROVIDER_SITE_OTHER): Payer: Medicare Other | Admitting: Cardiology

## 2015-02-09 ENCOUNTER — Encounter: Payer: Self-pay | Admitting: Cardiology

## 2015-02-09 VITALS — BP 122/68 | HR 81 | Ht 60.0 in | Wt 105.0 lb

## 2015-02-09 DIAGNOSIS — M79605 Pain in left leg: Secondary | ICD-10-CM | POA: Diagnosis not present

## 2015-02-09 DIAGNOSIS — R0602 Shortness of breath: Secondary | ICD-10-CM | POA: Diagnosis not present

## 2015-02-09 DIAGNOSIS — M79604 Pain in right leg: Secondary | ICD-10-CM | POA: Diagnosis not present

## 2015-02-09 DIAGNOSIS — R002 Palpitations: Secondary | ICD-10-CM

## 2015-02-09 NOTE — Progress Notes (Signed)
Cardiology Office Note  Date: 02/09/2015   ID: Crystal Lopez, DOB 1935-04-12, MRN [id number]  PCP: Bedelia PersonAARON,CAREN T, MD  Primary Cardiologist: Nona DellSamuel McDowell, MD   Chief Complaint  Patient presents with  . Shortness of Breath  . Leg discomfort    History of Present Illness: Crystal Lopez is a 80 y.o. female presenting to the office for a follow-up visit, a former long-term patient of Dr. Myrtis SerKatz now establishing with me. She last saw Dr. Myrtis SerKatz in March. This is our first meeting. I reviewed her history which includes recurring chest pain and palpitations for several years.  Previous cardiac workup [facility] which was negative for ischemia with LVEF 70%. More recently echocardiogram in July 2012 revealed mild basal septal hypertrophy with LVEF 55%, no regional wall motion abnormalities, grade 1 diastolic dysfunction, trivial aortic regurgitation and mitral regurgitation, mild left atrial enlargement, mild tricuspid regurgitation, no pericardial effusion.  She comes in today with her husband stating that she has been having significant problems with restless legs, both at nighttime and during the daytime over the last several weeks. She is on clonazepam per Dr. Clifton CustardAaron. She also reports having bilateral leg pain, left worse than right, which she thinks may be more of a problem with a "blockage." She does not describe distinct claudication however.  Unrelated to the above symptoms, she reports being short of breath with activity over the last several months associated with intermittent atypical chest pain. ECG done today is normal.   Past Medical History  Diagnosis Date  . Rib pain     March, 2012, injury from a fall  . Aspirin allergy   . Palpitations   . GERD (gastroesophageal reflux disease)   . Migraines   . Chest discomfort     Previous negative ischemic workup  . Sleep apnea     Suspected diagnosis    Past Surgical History    Procedure Laterality Date  . Appendectomy    . Hysterectomy    . Exploratory laparotomy      Current Outpatient Prescriptions  Medication Sig Dispense Refill  . cetirizine (ZYRTEC) 10 MG tablet Take 10 mg by mouth daily.    . clonazePAM (KLONOPIN) 2 MG tablet Take 2 mg by mouth 2 (two) times daily.     . Cyanocobalamin (B-12 PO) Take 1 tablet by mouth daily.    . Lidocaine HCl 2 % SOLN Inhale 2 mLs into the lungs as needed.      . meperidine (DEMEROL) 50 MG tablet Take 50 mg by mouth every 4 (four) hours as needed.      . nitroGLYCERIN (NITROSTAT) 0.3 MG SL tablet Place 1 tablet (0.3 mg total) under the tongue every 5 (five) minutes as needed for chest pain. 25 tablet 3  . pantoprazole (PROTONIX) 40 MG tablet Take 40 mg by mouth 2 (two) times daily.      . promethazine (PHENERGAN) 25 MG tablet Take 25 mg by mouth every 4 (four) hours as needed.      Marland Kitchen. VITAMIN D, CHOLECALCIFEROL, PO Take 1 capsule by mouth daily.       No current facility-administered medications for this visit.    Allergies:  Boniva; Codeine; Darvocet; Dilaudid; Erythromycin; Macrodantin; Morphine and related; Ropinirole hcl; Sulfa antibiotics; and Aspirin   Social History: The patient  reports that she quit smoking about 50 years ago. Her smoking use included Cigarettes. She has a 14 pack-year smoking history. She has never  used smokeless tobacco. She reports that she does not drink alcohol or use illicit drugs.   ROS:  Please see the history of present illness. Otherwise, complete review of systems is positive for jumping is in her legs, bilateral leg pain, anxiety, intermittent palpitations, atypical sharp chest pains, shortness of breath..  All other systems are reviewed and negative.   Physical Exam: VS:  BP 122/68 mmHg  Pulse 81  Ht 5' (1.524 m)  Wt 105 lb (47.628 kg)  BMI 20.51 kg/m2  SpO2 98%, BMI Body mass index is 20.51 kg/(m^2).  Wt Readings from Last 3 Encounters:  02/09/15 105 lb (47.628 kg)   11/09/14 107 lb (48.535 kg)  09/05/14 102 lb 9.6 oz (46.539 kg)     General: Thin woman, appears comfortable at rest. HEENT: Conjunctiva and lids normal, oropharynx clear. Neck: Supple, no elevated JVP or carotid bruits, no thyromegaly. Lungs: Clear to auscultation, nonlabored breathing at rest. Cardiac: Regular rate and rhythm, no S3 or significant systolic murmur, no pericardial rub. Abdomen: Soft, nontender, bowel sounds present. Extremities: No pitting edema, distal pulses 1-2+. Skin: Warm and dry. Musculoskeletal: Mild kyphosis. Neuropsychiatric: Alert and oriented x3, affect grossly appropriate but seems anxious.   ECG: ECG is ordered today and reviewed showing normal sinus rhythm.    Recent Labwork: 09/05/2014: BUN 5*; Creatinine 0.88; Hemoglobin 10.4*; Platelets 279; Potassium 4.1; Sodium 137    Assessment and Plan:  1. Bilateral leg pain, not typical for claudication based on description, and complicated by what sounds to be associated restless leg syndrome which is managed by Dr. Clifton Custard. Distal pulses are 1-2+, we will obtain lower extremity arterial Dopplers to exclude significant obstructive arterial disease.   2. Exertional shortness of breath, sounds to be fairly long-standing based on patient description and associated with atypical chest discomfort. Prior ischemic workup was reassuring, ECG is normal today. We will obtain an echocardiogram to reevaluate cardiac structure and function.   Current medicines were reviewed with the patient today.   Orders Placed This Encounter  Procedures  . EKG 12-Lead  . ECHOCARDIOGRAM COMPLETE    Disposition: FU with me in 6 months.   Signed, Jonelle Sidle, MD, [facility] 02/09/2015 12:37 PM    [facility] at [facility] [address] [place], [place], [place] [address] Phone: [phone]; Fax: [phone]

## 2015-02-09 NOTE — Patient Instructions (Signed)
Your physician recommends that you continue on your current medications as directed. Please refer to the Current Medication list given to you today. Your physician has requested that you have a lower extremity arterial exercise duplex with ABI's. During this test, exercise and ultrasound are used to evaluate arterial blood flow in the legs. Allow one hour for this exam. There are no restrictions or special instructions. Your physician has requested that you have an echocardiogram. Echocardiography is a painless test that uses sound waves to create images of your heart. It provides your doctor with information about the size and shape of your heart and how well your heart's chambers and valves are working. This procedure takes approximately one hour. There are no restrictions for this procedure. Your physician recommends that you schedule a follow-up appointment in: 6 months. You will receive a reminder letter in the mail in about 4 months reminding you to call and schedule your appointment. If you don't receive this letter, please contact our office. We will call you with your results.

## 2015-02-21 ENCOUNTER — Ambulatory Visit (INDEPENDENT_AMBULATORY_CARE_PROVIDER_SITE_OTHER): Payer: Medicare Other

## 2015-02-21 ENCOUNTER — Other Ambulatory Visit: Payer: Self-pay

## 2015-02-21 DIAGNOSIS — R0602 Shortness of breath: Secondary | ICD-10-CM

## 2015-02-21 DIAGNOSIS — M79604 Pain in right leg: Secondary | ICD-10-CM | POA: Diagnosis not present

## 2015-02-21 DIAGNOSIS — M79605 Pain in left leg: Secondary | ICD-10-CM | POA: Diagnosis not present

## 2015-02-22 ENCOUNTER — Encounter: Payer: Self-pay | Admitting: *Deleted

## 2015-02-22 ENCOUNTER — Telehealth: Payer: Self-pay | Admitting: *Deleted

## 2015-02-22 NOTE — Telephone Encounter (Signed)
-----   Message from Jonelle Sidle, MD sent at 02/22/2015  8:44 AM EDT ----- Reviewed. Please let her know that the study does not indicate any significant blockages in her leg arteries.

## 2015-02-22 NOTE — Telephone Encounter (Signed)
-----   Message from Jonelle Sidle, MD sent at 02/21/2015  4:47 PM EDT ----- Reviewed report. Please let patient know that her LV function is still normal, ejection fraction 60%. Trivial aortic regurgitation is of no major clinical significance. We will follow-up on the lower extremity arterial studies when available.

## 2015-02-22 NOTE — Telephone Encounter (Signed)
Husband informed

## 2015-07-31 ENCOUNTER — Ambulatory Visit (INDEPENDENT_AMBULATORY_CARE_PROVIDER_SITE_OTHER): Payer: Medicare Other | Admitting: Cardiology

## 2015-07-31 ENCOUNTER — Encounter: Payer: Self-pay | Admitting: Cardiology

## 2015-07-31 VITALS — BP 142/82 | HR 82 | Ht 60.0 in | Wt 105.0 lb

## 2015-07-31 DIAGNOSIS — M79604 Pain in right leg: Secondary | ICD-10-CM | POA: Diagnosis not present

## 2015-07-31 DIAGNOSIS — M79605 Pain in left leg: Secondary | ICD-10-CM

## 2015-07-31 DIAGNOSIS — R0602 Shortness of breath: Secondary | ICD-10-CM | POA: Diagnosis not present

## 2015-07-31 NOTE — Progress Notes (Signed)
Cardiology Office Note  Date: 07/31/2015   ID: Crystal Lopez, DOB [birth year], MRN [id number]  PCP: Joseph Art, MD  Primary Cardiologist: Rozann Lesches, MD   Chief Complaint  Patient presents with  . Cardiac follow-up    History of Present Illness: Crystal Lopez is a 80 y.o. female that I saw for the first time back in June of this year, a former patient of Dr. Ron Parker. She is here today with her husband for a routine follow-up visit. She states that she feels better overall, has not had any significant palpitations or chest pain. Since we last met, she states that she has weaned herself off of Demerol. She is also using some over-the-counter supplements including magnesium for leg cramps.  Follow-up testing obtained in June is outlined below including echocardiogram and lower extremity arterial Dopplers. We reviewed these results today, overall reassuring.   Past Medical History  Diagnosis Date  . Rib pain     March, 2012, injury from a fall  . Aspirin allergy   . Palpitations   . GERD (gastroesophageal reflux disease)   . Migraines   . Chest discomfort     Previous negative ischemic workup  . Sleep apnea     Suspected diagnosis    Current Outpatient Prescriptions  Medication Sig Dispense Refill  . ASTRAGALUS PO Take 1,000 mg by mouth daily.    . cetirizine (ZYRTEC) 10 MG tablet Take 10 mg by mouth daily.    . clonazePAM (KLONOPIN) 2 MG tablet Take 2 mg by mouth 2 (two) times daily.     . Cyanocobalamin (B-12 PO) Take 1 tablet by mouth daily.    Marland Kitchen MAGNESIUM CITRATE PO Take by mouth.    . nitroGLYCERIN (NITROSTAT) 0.3 MG SL tablet Place 1 tablet (0.3 mg total) under the tongue every 5 (five) minutes as needed for chest pain. 25 tablet 3  . Omega 3-6-9 Fatty Acids (TRIPLE OMEGA-3-6-9 PO) Take by mouth.    . pantoprazole (PROTONIX) 40 MG tablet Take 40 mg by mouth as needed.    . promethazine (PHENERGAN) 25 MG tablet Take 25 mg by mouth every 4 (four) hours as needed.        . Pumpkin Seed-Soy Germ (AZO BLADDER CONTROL/GO-LESS) CAPS Take by mouth.    Marland Kitchen VITAMIN D, CHOLECALCIFEROL, PO Take 1 capsule by mouth daily.       No current facility-administered medications for this visit.    Allergies:  Boniva; Codeine; Darvocet; Dilaudid; Erythromycin; Macrodantin; Morphine and related; Ropinirole hcl; Sulfa antibiotics; and Aspirin   Social History: The patient  reports that she quit smoking about 50 years ago. Her smoking use included Cigarettes. She has a 14 pack-year smoking history. She has never used smokeless tobacco. She reports that she does not drink alcohol or use illicit drugs.   ROS:  Please see the history of present illness. Otherwise, complete review of systems is positive for leg cramps.  All other systems are reviewed and negative.   Physical Exam: VS:  BP 142/82 mmHg  Pulse 82  Ht 5' (1.524 m)  Wt 105 lb (47.628 kg)  BMI 20.51 kg/m2  SpO2 95%, BMI Body mass index is 20.51 kg/(m^2).  Wt Readings from Last 3 Encounters:  07/31/15 105 lb (47.628 kg)  02/09/15 105 lb (47.628 kg)  11/09/14 107 lb (48.535 kg)     General: Thin woman, appears comfortable at rest. HEENT: Conjunctiva and lids normal, oropharynx clear. Neck: Supple, no elevated JVP or carotid bruits, no  thyromegaly. Lungs: Clear to auscultation, nonlabored breathing at rest. Cardiac: Regular rate and rhythm, no S3 or significant systolic murmur, no pericardial rub. Abdomen: Soft, nontender, bowel sounds present. Extremities: No pitting edema, distal pulses 1-2+.  ECG: Tracing from 02/09/2015 showed normal sinus rhythm.   Recent Labwork: 09/05/2014: BUN 5*; Creat 0.88; Hemoglobin 10.4*; Platelets 279; Potassium 4.1; Sodium 137   Other Studies Reviewed Today:  Echocardiogram 02/21/2015: Study Conclusions  - Left ventricle: The cavity size was normal. Wall thickness was normal. The estimated ejection fraction was 60%. Wall motion was normal; there were no regional wall  motion abnormalities. - Aortic valve: There was trivial regurgitation. - Right ventricle: The cavity size was normal. Systolic function was normal.  Lower extremity arterial Dopplers 02/21/2015: Normal bilateral ABIs with no evidence of segmental lower extremity arterial disease at rest.  Assessment and Plan:  1. Shortness of breath, improved overall. Follow-up echocardiogram from June showed normal LVEF, no major valvular abnormalities.  2. Leg pain with intermittent cramps. No evidence of arterial obstructive disease by Dopplers in June.  Current medicines were reviewed with the patient today.  Disposition: FU with me in 8 months.   Signed, Satira Sark, MD, [facility] 07/31/2015 11:28 AM    [facility] [place], [place], [place] [address] Phone: [phone]; Fax: [phone]

## 2015-07-31 NOTE — Patient Instructions (Signed)
Your physician recommends that you continue on your current medications as directed. Please refer to the Current Medication list given to you today. Your physician recommends that you schedule a follow-up appointment in: 8 months. You will receive a reminder letter in the mail in about 6 months reminding you to call and schedule your appointment. If you don't receive this letter, please contact our office. 

## 2015-11-12 ENCOUNTER — Ambulatory Visit (INDEPENDENT_AMBULATORY_CARE_PROVIDER_SITE_OTHER): Payer: Medicare Other | Admitting: Cardiology

## 2015-11-12 ENCOUNTER — Encounter: Payer: Self-pay | Admitting: Cardiology

## 2015-11-12 VITALS — BP 148/76 | HR 86 | Ht 60.0 in | Wt 105.4 lb

## 2015-11-12 DIAGNOSIS — R002 Palpitations: Secondary | ICD-10-CM

## 2015-11-12 DIAGNOSIS — R0789 Other chest pain: Secondary | ICD-10-CM

## 2015-11-12 DIAGNOSIS — M79605 Pain in left leg: Secondary | ICD-10-CM

## 2015-11-12 NOTE — Patient Instructions (Signed)
Your physician wants you to follow-up in: 6 months with Dr. McDowell You will receive a reminder letter in the mail two months in advance. If you don't receive a letter, please call our office to schedule the follow-up appointment.  Your physician recommends that you continue on your current medications as directed. Please refer to the Current Medication list given to you today.  Thank you for choosing [facility]!!    

## 2015-11-12 NOTE — Progress Notes (Signed)
Cardiology Office Note  Date: 11/12/2015   ID: Crystal Lopez, DOB [birth year], MRN [id number]  PCP: Bedelia Person, MD  Primary Cardiologist: Nona Dell, MD   Chief Complaint  Patient presents with  . Cardiac follow-up    History of Present Illness: Crystal Lopez is a 81 y.o. female last seen in November 2016. She presents today with her husband to discuss symptoms. She complains of an intermittent pain in her left leg, usually notices it at nighttime, begins often as a burning in her left thigh and moves down into the lower leg. Sometimes she states that this makes her leg "jump." It is not exertional. She also has mild dependent edema particularly on long trips or when she is up on her feet during the day.  She reports a single episode of epigastric discomfort that moved down into her left leg and also up into her left chest and arm. His was sporadic and occurred at nighttime as well. She has no reproducible exertional chest pain.  Today we discussed previous testing. She has already undergone lower extremity arterial studies based on prior complaints of leg pain, and these were normal not indicating any clear evidence of obstructive PAD. She has normal DPs bilaterally. She does have some mild spider veins in her legs and likely has some degree of venous insufficiency.  She has no clearly documented history of obstructive CAD with previous negative ischemic workup. She does have family history of CAD, and is worried about her heart and whether she will have the same fate other family members.  She has had trouble with hip pain and has undergone prior injections.  Past Medical History  Diagnosis Date  . Rib pain     March, 2012, injury from a fall  . Aspirin allergy   . Palpitations   . GERD (gastroesophageal reflux disease)   . Migraines   . Chest discomfort     Previous negative ischemic workup  . Sleep apnea     Suspected diagnosis    Current Outpatient Prescriptions    Medication Sig Dispense Refill  . acetaminophen (TYLENOL) 500 MG tablet Take 1,000 mg by mouth at bedtime.    . ASTRAGALUS PO Take 1,000 mg by mouth daily.    . clonazePAM (KLONOPIN) 2 MG tablet Take 1 mg by mouth at bedtime.     . Cyanocobalamin (B-12 PO) Take 1 tablet by mouth daily.    . Fluticasone Propionate (FLONASE NA) Place into the nose every morning.    Marland Kitchen MAGNESIUM CITRATE PO Take 2 tablets by mouth at bedtime.     . nitroGLYCERIN (NITROSTAT) 0.3 MG SL tablet Place 1 tablet (0.3 mg total) under the tongue every 5 (five) minutes as needed for chest pain. 25 tablet 3  . Omega 3-6-9 Fatty Acids (TRIPLE OMEGA-3-6-9 PO) Take by mouth.    . pantoprazole (PROTONIX) 40 MG tablet Take 40 mg by mouth as needed.    . promethazine (PHENERGAN) 25 MG tablet Take 12.5 mg by mouth every morning.     Marland Kitchen Pumpkin Seed-Soy Germ (AZO BLADDER CONTROL/GO-LESS) CAPS Take by mouth as needed.     . ranitidine (ZANTAC) 150 MG tablet Take 150 mg by mouth as needed for heartburn.    Marland Kitchen VITAMIN D, CHOLECALCIFEROL, PO Take 1 capsule by mouth daily.       No current facility-administered medications for this visit.   Allergies:  Boniva; Codeine; Darvocet; Dilaudid; Erythromycin; Macrodantin; Morphine and related; Ropinirole hcl; Sulfa antibiotics; and Aspirin  Social History: The patient  reports that she quit smoking about 51 years ago. Her smoking use included Cigarettes. She has a 14 pack-year smoking history. She has never used smokeless tobacco. She reports that she does not drink alcohol or use illicit drugs.   ROS:  Please see the history of present illness. Otherwise, complete review of systems is positive for anxiety, insomnia at times.  All other systems are reviewed and negative.   Physical Exam: VS:  BP 148/76 mmHg  Pulse 86  Ht 5' (1.524 m)  Wt 105 lb 6.4 oz (47.809 kg)  BMI 20.58 kg/m2  SpO2 97%, BMI Body mass index is 20.58 kg/(m^2).  Wt Readings from Last 3 Encounters:  11/12/15 105 lb 6.4  oz (47.809 kg)  07/31/15 105 lb (47.628 kg)  02/09/15 105 lb (47.628 kg)    General: Thin eldaerly woman, appears comfortable at rest. HEENT: Conjunctiva and lids normal, oropharynx clear. Neck: Supple, no elevated JVP or carotid bruits, no thyromegaly. Lungs: Clear to auscultation, nonlabored breathing at rest. Cardiac: Regular rate and rhythm, no S3 or significant systolic murmur, no pericardial rub. Abdomen: Soft, nontender, bowel sounds present. Extremities: No pitting edema, distal pulses 2+. Musculoskeletal: No kyphosis. Neuropsychiatric: Alert and oriented 3, affect appropriate.  ECG: I personally reviewed the prior tracing from 02/09/2015 which showed normal sinus rhythm.  Recent Labwork: 09/05/2014: BUN 5*; Creat 0.88; Hemoglobin 10.4*; Platelets 279; Potassium 4.1; Sodium 137   Other Studies Reviewed Today:  Echocardiogram 02/21/2015: Study Conclusions  - Left ventricle: The cavity size was normal. Wall thickness was normal. The estimated ejection fraction was 60%. Wall motion was normal; there were no regional wall motion abnormalities. - Aortic valve: There was trivial regurgitation. - Right ventricle: The cavity size was normal. Systolic function was normal.  Lower extremity arterial Dopplers 02/21/2015: Normal bilateral ABIs with no evidence of segmental lower extremity arterial disease at rest.  Assessment and Plan:  1. Left leg pain as outlined above, atypical for arterial disease or DVT, features suggest possible restless leg syndrome or even a neuropathic etiology. She has already undergone lower extremity Doppler studies which were normal. Normal DP as well. Encouraged her to make follow-up visit with her PCP.  2. Episode of epigastric discomfort with radiation to the left leg and up into the left chest and arm. This occurred at rest, no recurrent chest discomfort with activity. She has undergone previous ischemic testing. For now would recommend  observation.  3. History of palpitations, none reported recently.  Current medicines were reviewed with the patient today.  Disposition: FU with me in 6 months.   Signed, Jonelle SidleSamuel G. Khyra Viscuso, MD, [facility] 11/12/2015 3:30 PM    [facility] [address], [place], [place] [address] Phone: [phone]) [phone]; Fax: [phone]

## 2016-02-27 ENCOUNTER — Encounter: Payer: Self-pay | Admitting: Cardiology

## 2016-02-27 ENCOUNTER — Ambulatory Visit (INDEPENDENT_AMBULATORY_CARE_PROVIDER_SITE_OTHER): Payer: Medicare Other | Admitting: Cardiology

## 2016-02-27 VITALS — BP 138/80 | HR 82 | Ht 60.0 in | Wt 104.0 lb

## 2016-02-27 DIAGNOSIS — Z136 Encounter for screening for cardiovascular disorders: Secondary | ICD-10-CM | POA: Diagnosis not present

## 2016-02-27 DIAGNOSIS — R519 Headache, unspecified: Secondary | ICD-10-CM

## 2016-02-27 DIAGNOSIS — R51 Headache: Secondary | ICD-10-CM

## 2016-02-27 DIAGNOSIS — G44019 Episodic cluster headache, not intractable: Secondary | ICD-10-CM

## 2016-02-27 MED ORDER — LOSARTAN POTASSIUM 100 MG PO TABS: 100.0000 mg | ORAL_TABLET | Freq: Every day | ORAL | Status: DC

## 2016-02-27 MED ORDER — CARVEDILOL 6.25 MG PO TABS: 6.2500 mg | ORAL_TABLET | Freq: Two times a day (BID) | ORAL | Status: DC

## 2016-02-27 NOTE — Patient Instructions (Signed)
Medication Instructions:  Continue all current medications.  Labwork: NONE  Testing/Procedures: NONE  Referrals:    [facility] - Dr. Naomie DeanAntonia Ahern   Follow-Up: Your physician wants you to follow up in: 6 months.  You will receive a reminder letter in the mail one-two months in advance.  If you don't receive a letter, please call our office to schedule the follow up appointment    If you need a refill on your cardiac medications before your next appointment, please call your pharmacy.

## 2016-02-27 NOTE — Progress Notes (Signed)
Cardiology Office Note  Date: 02/27/2016   ID: Crystal Lopez, DOB [birth year], MRN [id number]  PCP: Bedelia Person, MD  Primary Cardiologist: Nona Dell, MD   Chief Complaint  Patient presents with  . Headaches    History of Present Illness: Crystal Lopez is a 81 y.o. female last seen in March. She comes to the office a with her husband to discuss headaches that she has had. She states that she has a history of "cluster migraines" and has followed with a neurologist in the past. Reportedly, she has started the have worsening symptoms associated with left-sided visual changes, had an MRI done that was reportedly normal, then placed on carbamazepine by her PCP, Dr. Clifton Custard. Patient states that Dr. Clifton Custard is out of town, and she made this visit to see if she we could refer her to a neurologist in the region.  She states that she has had chest pain symptoms while taking the carbamazepine and decided to stop it. Symptoms are atypical for angina based on description. I reviewed her ECG today which shows normal sinus rhythm. She has had no escalating palpitations, no syncope.  Past Medical History  Diagnosis Date  . Rib pain     March, 2012, injury from a fall  . Aspirin allergy   . Palpitations   . GERD (gastroesophageal reflux disease)   . Migraines   . Chest discomfort     Previous negative ischemic workup  . Sleep apnea     Suspected diagnosis    Current Outpatient Prescriptions  Medication Sig Dispense Refill  . acetaminophen (TYLENOL) 500 MG tablet Take 1,000 mg by mouth at bedtime.    . ASTRAGALUS PO Take 1,000 mg by mouth daily.    . benzonatate (TESSALON) 100 MG capsule Take 100 mg by mouth as needed for cough.    . clonazePAM (KLONOPIN) 2 MG tablet Take 1 mg by mouth at bedtime.     . Cyanocobalamin (B-12 PO) Take 1 tablet by mouth daily.    . Esomeprazole Magnesium (NEXIUM PO) Take by mouth as needed.    . Fluticasone Propionate (FLONASE NA) Place into the nose every  morning.    Marland Kitchen MAGNESIUM CITRATE PO Take 2 tablets by mouth at bedtime.     . naproxen sodium (ANAPROX) 220 MG tablet Take 220 mg by mouth as needed.    . nitroGLYCERIN (NITROSTAT) 0.3 MG SL tablet Place 1 tablet (0.3 mg total) under the tongue every 5 (five) minutes as needed for chest pain. 25 tablet 3  . Omega 3-6-9 Fatty Acids (TRIPLE OMEGA-3-6-9 PO) Take by mouth.    . promethazine (PHENERGAN) 25 MG tablet Take 12.5 mg by mouth every morning.     Marland Kitchen Pumpkin Seed-Soy Germ (AZO BLADDER CONTROL/GO-LESS) CAPS Take by mouth as needed.     . ranitidine (ZANTAC) 150 MG tablet Take 150 mg by mouth as needed for heartburn.    . Riboflavin 100 MG CAPS Take by mouth daily.    Marland Kitchen VITAMIN D, CHOLECALCIFEROL, PO Take 1 capsule by mouth daily.       No current facility-administered medications for this visit.   Allergies:  Boniva; Codeine; Darvocet; Dilaudid; Erythromycin; Macrodantin; Morphine and related; Ropinirole hcl; Sulfa antibiotics; and Aspirin   Social History: The patient  reports that she quit smoking about 51 years ago. Her smoking use included Cigarettes. She has a 14 pack-year smoking history. She has never used smokeless tobacco. She reports that she does not drink alcohol or use  illicit drugs.   ROS:  Please see the history of present illness. Otherwise, complete review of systems is positive for worsening headaches, left-sided visual changes intermittently, no focal motor weakness, no seizure-like activity.  All other systems are reviewed and negative.   Physical Exam: VS:  BP 138/80 mmHg  Pulse 82  Ht 5' (1.524 m)  Wt 104 lb (47.174 kg)  BMI 20.31 kg/m2  SpO2 98%, BMI Body mass index is 20.31 kg/(m^2).  Wt Readings from Last 3 Encounters:  02/27/16 104 lb (47.174 kg)  11/12/15 105 lb 6.4 oz (47.809 kg)  07/31/15 105 lb (47.628 kg)    General: Thin eldaerly woman, appears comfortable at rest. HEENT: Conjunctiva and lids normal, oropharynx clear. Neck: Supple, no elevated JVP or  carotid bruits, no thyromegaly. Lungs: Clear to auscultation, nonlabored breathing at rest. Cardiac: Regular rate and rhythm, no S3 or significant systolic murmur, no pericardial rub. Abdomen: Soft, nontender, bowel sounds present. Extremities: No pitting edema, distal pulses 2+. Musculoskeletal: No kyphosis. Neuropsychiatric: Alert and oriented 3, affect appropriate.  ECG: I personally reviewed the tracing from 02/09/2015 which showed normal sinus rhythm.  Recent Labwork:  09/05/2014: BUN 5*; Creat 0.88; Hemoglobin 10.4*; Platelets 279; Potassium 4.1; Sodium 137   Other Studies Reviewed Today:  Echocardiogram 02/21/2015: Study Conclusions  - Left ventricle: The cavity size was normal. Wall thickness was normal. The estimated ejection fraction was 60%. Wall motion was normal; there were no regional wall motion abnormalities. - Aortic valve: There was trivial regurgitation. - Right ventricle: The cavity size was normal. Systolic function was normal.  Lower extremity arterial Dopplers 02/21/2015: Normal bilateral ABIs with no evidence of segmental lower extremity arterial disease at rest.  Assessment and Plan:  1. Patient reporting a history of "cluster migraines" and previous neurological follow-up. She is having worsening symptoms, was placed on carbamazepine by her PCP Dr. Clifton CustardAaron, although has not tolerated it. She is requesting referral to a neurologist in the region.  2. History of recurring chest pain over the years with negative ischemic workup. ECG is normal today. Echocardiogram from last year showed normal LVEF with no major valvular abnormalities. No further workup planned at this time.  Current medicines were reviewed with the patient today.   Orders Placed This Encounter  Procedures  . EKG 12-Lead    Disposition: Follow-up in 6 months.  Signed, Jonelle SidleSamuel G. McDowell, MD, [facility] 02/27/2016 1:29 PM    [facility] [address]  [place], [place], [place] [address] Phone: [phone](336) [phone]; Fax: [phone]

## 2016-02-27 NOTE — Addendum Note (Signed)
Addended by: Lesle ChrisHILL, Kavita Bartl G on: 02/27/2016 02:47 PM   Modules accepted: Orders, Medications

## 2016-02-27 NOTE — Addendum Note (Signed)
Addended by: Lesle ChrisHILL, Seaira Byus G on: 02/27/2016 02:18 PM   Modules accepted: Orders

## 2016-03-05 ENCOUNTER — Telehealth: Payer: Self-pay | Admitting: Neurology

## 2016-03-05 NOTE — Telephone Encounter (Signed)
Patient called to schedule new patient appointment ASAP with Dr. Lucia GaskinsAhern, was referred by Dr. Diona BrownerMcDowell.

## 2016-03-13 ENCOUNTER — Encounter: Payer: Self-pay | Admitting: Neurology

## 2016-03-13 ENCOUNTER — Ambulatory Visit (INDEPENDENT_AMBULATORY_CARE_PROVIDER_SITE_OTHER): Payer: Medicare Other | Admitting: Neurology

## 2016-03-13 VITALS — BP 180/82 | HR 70 | Resp 16 | Ht 60.0 in | Wt 105.0 lb

## 2016-03-13 DIAGNOSIS — G509 Disorder of trigeminal nerve, unspecified: Secondary | ICD-10-CM | POA: Diagnosis not present

## 2016-03-13 DIAGNOSIS — G43011 Migraine without aura, intractable, with status migrainosus: Secondary | ICD-10-CM

## 2016-03-13 DIAGNOSIS — G43001 Migraine without aura, not intractable, with status migrainosus: Secondary | ICD-10-CM

## 2016-03-13 DIAGNOSIS — E538 Deficiency of other specified B group vitamins: Secondary | ICD-10-CM

## 2016-03-13 DIAGNOSIS — G529 Cranial nerve disorder, unspecified: Secondary | ICD-10-CM

## 2016-03-13 DIAGNOSIS — Z79899 Other long term (current) drug therapy: Secondary | ICD-10-CM | POA: Diagnosis not present

## 2016-03-13 DIAGNOSIS — G43919 Migraine, unspecified, intractable, without status migrainosus: Secondary | ICD-10-CM | POA: Insufficient documentation

## 2016-03-13 DIAGNOSIS — G500 Trigeminal neuralgia: Secondary | ICD-10-CM

## 2016-03-13 MED ORDER — GABAPENTIN 300 MG PO CAPS: 1200.0000 mg | ORAL_CAPSULE | Freq: Three times a day (TID) | ORAL | Status: DC

## 2016-03-13 NOTE — Progress Notes (Signed)
Nerve Block: Lidocaine 2% Lot: [id number] Expiration 07/2019 NDC: [id number]  Marcaine 0.5% Lot: [id number] Expiration: 07/09/2017 NDC: [phone]

## 2016-03-13 NOTE — Patient Instructions (Signed)
As far as your medications are concerned, I would like to suggest: After one week of 900mg  three times a  Day increase to 1200mg  three times a day   As far as diagnostic testing: Lab today  I would like to see you back in 6 weeks, sooner if we need to. Please call us with any interim questions, concerns, problems, updates or refill requests.   Our phone number is [phone]. We also have an after hours call service for urgent matters and there is a physician on-call for urgent questions. For any emergencies you know to call 911 or go to the nearest emergency room

## 2016-03-13 NOTE — Progress Notes (Signed)
[facility]    Provider:  Dr Lucia Gaskins Referring Provider: Bedelia Person, MD Primary Care Physician:  Bedelia Person, MD  CC:  Migraine   HPI:  Crystal Lopez is a 81 y.o. female here as a referral from Dr. Clifton Custard for migraines. She has had headaches, cluster migraines over 10 years ago without inciting event. She was using demoral and weaned herself off. She saw eye surgeons and did MRIs of the head and orbit and everything normal. She has left eye pain and the eye starts to "draw" up with the headache which is  throbbing, pounding, starts aching, she can't stand the light, she has to turn off all the lights, she has had her current headache  for over 4 weeks now without relief. She has been taking tylenol every day. She is taking alleve. She has tried "everything" over the years for her headaches, She recently tried tegretol and she could not tolerate it. She has stopped it. She just started gabapentin. She has been diagnosed with cluster migraines. With headaches she reports some nausea. No triggers, unknown what makes it worse. Only demerol in the past has made it better. The pain is unbearable 10/10 today. She has had a headache every day for the last 6 months. Neurontin is helping. She is increasing the medication to 600mg  three times and then 900mg  three times a day. She has been on the neurontin in the past and it helped. She was taking demerol every day 1-2x a day. She has had "needles everywhere" and even tried Botox in the past. No other focal neurologic deficits or complaints. No vision changes.   Tried: tegretol, Topamax and Depakote. Tegretol. Cannot take imitrex because it makes her deathly sick, amitriptyline, verapamil, botox for migraine all failed.  could try: keppra or nortriptyline. Propranolol has never tried.   Reviewed notes, labs and imaging from outside physicians, which showed:   Patient was seen by Dr. Diona Browner with headaches, she has a history of "cluster  migraines" and had followed with a neurologist in the past. She started having worsening symptoms associated with left-sided visual changes, had an MRI done that was reportedly normal, and placed on Tegretol by her PCP Dr. Clifton Custard. Since her PCP is out of town she requested by Dr. Diona Browner give her a referral to neurology. She reported chest pain with the carbamazepine decided to stop it. EKG was normal and showed normal sinus rhythm. She denied palpitations or syncope.  Personally reviewed EKG tracing, normal sinus rhythm with QTc 393.  Patient brings in her own notes today: Her emergency contact is her daughter Verlon Au in [place] [phone]. Her husband is Dorene Sorrow and his cell phone number [phone]. [facility] for pain in the left eye, chronic since cataract surgery several years ago, no corneal findings, doubt epithelial debridement would relieve the symptoms. She has intraocular lens. No association between cataract surgery and pain. She was also seen at [facility]. She presented with a chief complaint of constant headache for 3 weeks this was on 03/05/2016 not the result of an injury, Tegretol made her sick. She was given Decadron and Toradol. Tightness of headache and nausea.  Review of Systems: Patient complains of symptoms per HPI as well as the following symptoms: No current chest pain or shortness of breath. She endorses fatigue, eye pain, easy bruising, cough, incontinence, headache, restless legs. Pertinent negatives per HPI. All others negative.   Social History  Social History  . Marital Status: Married    Spouse Name: N/A  . Number of Children: 6  . Years of Education: N/A   Occupational History  . Retired    Social History Main Topics  . Smoking status: Former Smoker -- 2.00 packs/day for 7 years    Types: Cigarettes    Quit date: 09/08/1964  . Smokeless tobacco: Never Used  . Alcohol  Use: No  . Drug Use: No  . Sexual Activity: Not on file   Other Topics Concern  . Not on file   Social History Narrative   Drinks "a little" coffee every morning     Family History  Problem Relation Age of Onset  . Heart failure Mother   . Heart disease Father   . Heart failure Father   . Heart disease Sister   . Heart attack Sister   . Heart disease Brother   . Heart attack Brother   . Migraines Neg Hx     Past Medical History  Diagnosis Date  . Rib pain     March, 2012, injury from a fall  . Aspirin allergy   . Palpitations   . GERD (gastroesophageal reflux disease)   . Migraines   . Chest discomfort     Previous negative ischemic workup  . Sleep apnea     Suspected diagnosis    Past Surgical History  Procedure Laterality Date  . Appendectomy    . Hysterectomy    . Exploratory laparotomy    . L temple cut      Current Outpatient Prescriptions  Medication Sig Dispense Refill  . acetaminophen (TYLENOL) 500 MG tablet Take 1,000 mg by mouth at bedtime.    . ASTRAGALUS PO Take 1,000 mg by mouth daily.    . benzonatate (TESSALON) 100 MG capsule Take 100 mg by mouth as needed for cough.    . clonazePAM (KLONOPIN) 2 MG tablet Take 1 mg by mouth at bedtime.     . Cyanocobalamin (B-12 PO) Take 1 tablet by mouth daily.    . Esomeprazole Magnesium (NEXIUM PO) Take by mouth as needed.    . Fluticasone Propionate (FLONASE NA) Place into the nose every morning.    . gabapentin (NEURONTIN) 300 MG capsule Take 4 capsules (1,200 mg total) by mouth 3 (three) times daily. 270 capsule 12  . MAGNESIUM CITRATE PO Take 2 tablets by mouth at bedtime.     . naproxen sodium (ANAPROX) 220 MG tablet Take 220 mg by mouth as needed.    . nitroGLYCERIN (NITROSTAT) 0.3 MG SL tablet Place 1 tablet (0.3 mg total) under the tongue every 5 (five) minutes as needed for chest pain. 25 tablet 3  . Omega 3-6-9 Fatty Acids (TRIPLE OMEGA-3-6-9 PO) Take by mouth.    . promethazine (PHENERGAN) 25 MG  tablet Take 12.5 mg by mouth every morning.     Marland Kitchen. Pumpkin Seed-Soy Germ (AZO BLADDER CONTROL/GO-LESS) CAPS Take by mouth as needed.     . ranitidine (ZANTAC) 150 MG tablet Take 150 mg by mouth as needed for heartburn.    . Riboflavin 100 MG CAPS Take by mouth daily.    Marland Kitchen. VITAMIN D, CHOLECALCIFEROL, PO Take 1 capsule by mouth daily.       No current facility-administered medications for this visit.    Allergies as of 03/13/2016 - Review Complete 02/27/2016  Allergen Reaction Noted  . Boniva [ibandronate sodium]  02/27/2011  . Codeine  02/27/2011  . Darvocet [propoxyphene n-acetaminophen]  01/14/2012  . Depakote er [divalproex sodium er]  03/13/2016  . Dilaudid [hydromorphone hcl]  02/27/2011  . Erythromycin  02/27/2011  . Levaquin [levofloxacin in d5w]  03/13/2016  . Macrodantin  02/27/2011  . Morphine and related  02/27/2011  . Ropinirole hcl  02/27/2011  . Sulfa antibiotics  02/27/2011  . Tegretol [carbamazepine]  03/13/2016  . Topamax [topiramate]  03/13/2016  . Aspirin  02/27/2011    Vitals: BP 180/82 mmHg  Pulse 70  Resp 16  Ht 5' (1.524 m)  Wt 105 lb (47.628 kg)  BMI 20.51 kg/m2 Last Weight:  Wt Readings from Last 1 Encounters:  03/13/16 105 lb (47.628 kg)   Last Height:   Ht Readings from Last 1 Encounters:  03/13/16 5' (1.524 m)    Physical exam: Exam: Gen: NAD, conversant, well nourised, thin, well groomed                     CV: RRR, no MRG. No Carotid Bruits. No peripheral edema, warm, nontender Eyes: Conjunctivae clear without exudates or hemorrhage  Neuro: Detailed Neurologic Exam  Speech:    Speech is normal; fluent and spontaneous with normal comprehension.  Cognition:    The patient is oriented to person, place, and time;     recent and remote memory intact;     language fluent;     normal attention, concentration,     fund of knowledge Cranial Nerves:    The pupils are equal, round, and reactive to light. The fundi are normal and  spontaneous venous pulsations are present. Visual fields are full to finger confrontation. Extraocular movements are intact. Trigeminal sensation is intact and the muscles of mastication are normal. The face is symmetric. The palate elevates in the midline. Hearing intact. Voice is normal. Shoulder shrug is normal. The tongue has normal motion without fasciculations.   Coordination:    Normal finger to nose and heel to shin.   Gait:    Heel-toe and tandem gait are normal.   Motor Observation:    No asymmetry, no atrophy, and no involuntary movements noted. Tone:    Normal muscle tone.    Posture:    Posture is normal. normal erect    Strength: proximal left>right LE weakness otherwise 4+-5     Strength is V/V in the upper and lower limbs.      Sensation: intact to LT     Reflex Exam:  DTR's:    Deep tendon reflexes in the upper and lower extremities are normal bilaterally.   Toes:    The toes are downgoing bilaterally.   Clonus:    Clonus is absent.      Assessment/Plan:  81 year old female with chronic migraines without aura intractable. She has failed multiple medications as detailed in history of present illness.  -nerve blocks performed today left trigeminal, supraorbital, suprascapular for intractable migraine, temporal pain, trigeminal pain - need mri results from [facility], will request - need current labs cbc, bmp, wil order - increase neurontin up to 3600mg /day divided tid, discussed side effects as documented in the patient instructions. - add nortriptyline next if neurontin does not work - serial nerve blocks if necessary - stop taking daily tylenol, rebound/medication overuse headache  To prevent or relieve headaches, try the following: Cool Compress. Lie down and place a cool compress on your head.  Avoid headache triggers. If certain foods or odors seem to have triggered your migraines in the past, avoid them. A headache diary might  help you identify  triggers.  Include physical activity in your daily routine. Try a daily walk or other moderate aerobic exercise.  Manage stress. Find healthy ways to cope with the stressors, such as delegating tasks on your to-do list.  Practice relaxation techniques. Try deep breathing, yoga, massage and visualization.  Eat regularly. Eating regularly scheduled meals and maintaining a healthy diet might help prevent headaches. Also, drink plenty of fluids.  Follow a regular sleep schedule. Sleep deprivation might contribute to headaches Consider biofeedback. With this mind-body technique, you learn to control certain bodily functions - such as muscle tension, heart rate and blood pressure - to prevent headaches or reduce headache pain.    Proceed to emergency room if you experience new or worsening symptoms or symptoms do not resolve, if you have new neurologic symptoms or if headache is severe, or for any concerning symptom.   Patient was consented for left-sided auricular temporal, supraorbital,  trigeminal nerve blocks. A solution containing below ingredients was prepared in 3 3-CC syringes with 30 gauge 1/2 inch needle and injected in locations on the left as detailed below.   The ariculotemporal nerve [phone]) was identified along the posterior margin of the sternocleidomastoid mastoid muscle towards the base of the ear. Injection represents separate unique surgical service.   The supraorbital nerve [id number]) was identified along with incision of the frontal bone of the orbit/supraorbital ridge. Injection represents separate unique surgical service.  The trigeminal [id number]) was identified by pain and palpation in the temporal area and the volar to the tragus. Injection represents separate unique surgical service.  The facial nerve block [phone]) the temporal nerve branch of the facial nerve is identified. Medication was injected into the left facial nerve area.  Injection represents separate unique  surgical service.  The sites junctions were sterilized with alcohol wipes.  The contents of each syringe was injected in a fanlike fashion on each side. The headache improved from 10/10 to 5/10. Patient tolerated the procedure well and no complications were noted.   Consent was provided below and patient acknowledged understanding:   Nerve Block:  Lidocaine 2% 4.28ml Lot: [id number]  Expiration 07/2019  NDC: 47829-562-13  Marcaine 0.5% 4.60ml Lot: [id number]  Expiration: 07/09/2017  NDC: 0865-7846-96  CC: Dr. Clifton Custard  What to expect afterwards?  Immediately after the injection, the back of your head may feel warm and numb. You may also experience reduction in the pain.   The pain relief is vary variable and can last from a few days to several months. Some patients do not experience any pain relief. Hence it is difficult to predict the outcome of the injection treatment in a particular patient.   There may be some discomfort at the injection site for a couple of days after treatment, however, this should settle quite quickly. We advise you to take things easy for the rest of the day. Continue taking your pain medication as advised by your consultant or until you feel benefit from the treatment.   What are the side effects / complications?  Common   Soreness / bruising at the injection site.   Temporary increase (up to 7 days) in pain following procedure.   Rare   Bleeding   Infection at the injection site   Allergic reaction   New pain   Worsening pain      Naomie Dean, MD  [facility] [address] [place], [place] [address]  Phone [phone] Fax [phone]

## 2016-03-17 ENCOUNTER — Telehealth: Payer: Self-pay | Admitting: Neurology

## 2016-03-17 ENCOUNTER — Ambulatory Visit (INDEPENDENT_AMBULATORY_CARE_PROVIDER_SITE_OTHER): Payer: Medicare Other | Admitting: Neurology

## 2016-03-17 VITALS — BP 178/78 | HR 82 | Temp 98.6°F

## 2016-03-17 DIAGNOSIS — G44001 Cluster headache syndrome, unspecified, intractable: Secondary | ICD-10-CM | POA: Diagnosis not present

## 2016-03-17 DIAGNOSIS — G43011 Migraine without aura, intractable, with status migrainosus: Secondary | ICD-10-CM

## 2016-03-17 DIAGNOSIS — G43001 Migraine without aura, not intractable, with status migrainosus: Secondary | ICD-10-CM

## 2016-03-17 LAB — COMPREHENSIVE METABOLIC PANEL
A/G RATIO: 1.5 (ref 1.2–2.2)
ALK PHOS: 47 IU/L (ref 39–117)
ALT: 9 IU/L (ref 0–32)
AST: 15 IU/L (ref 0–40)
Albumin: 4 g/dL (ref 3.5–4.8)
BILIRUBIN TOTAL: 0.2 mg/dL (ref 0.0–1.2)
BUN/Creatinine Ratio: 13 (ref 12–28)
BUN: 10 mg/dL (ref 8–27)
CALCIUM: 9.7 mg/dL (ref 8.7–10.3)
CHLORIDE: 100 mmol/L (ref 96–106)
CO2: 23 mmol/L (ref 18–29)
Creatinine, Ser: 0.8 mg/dL (ref 0.57–1.00)
GFR calc non Af Amer: 71 mL/min/{1.73_m2} (ref >59)
GFR, EST AFRICAN AMERICAN: 82 mL/min/{1.73_m2} (ref >59)
GLUCOSE: 96 mg/dL (ref 65–99)
Globulin, Total: 2.7 g/dL (ref 1.5–4.5)
POTASSIUM: 4.8 mmol/L (ref 3.5–5.2)
Sodium: 141 mmol/L (ref 134–144)
TOTAL PROTEIN: 6.7 g/dL (ref 6.0–8.5)

## 2016-03-17 LAB — CBC
HEMOGLOBIN: 10.5 g/dL — AB (ref 11.1–15.9)
Hematocrit: 33.9 % — ABNORMAL LOW (ref 34.0–46.6)
MCH: 26.7 pg (ref 26.6–33.0)
MCHC: 31 g/dL — AB (ref 31.5–35.7)
MCV: 86 fL (ref 79–97)
Platelets: 246 10*3/uL (ref 150–379)
RBC: 3.93 x10E6/uL (ref 3.77–5.28)
RDW: 14.4 % (ref 12.3–15.4)
WBC: 5.5 10*3/uL (ref 3.4–10.8)

## 2016-03-17 LAB — METHYLMALONIC ACID, SERUM: Methylmalonic Acid: 440 nmol/L — ABNORMAL HIGH (ref 0–378)

## 2016-03-17 LAB — B12 AND FOLATE PANEL
Folate: 11.2 ng/mL (ref >3.0)
VITAMIN B 12: 523 pg/mL (ref 211–946)

## 2016-03-17 MED ORDER — AMITRIPTYLINE HCL 10 MG PO TABS: 10.0000 mg | ORAL_TABLET | Freq: Every day | ORAL | Status: AC

## 2016-03-17 MED ORDER — DIHYDROERGOTAMINE MESYLATE 1 MG/ML IJ SOLN
0.5000 mg | Freq: Once | INTRAMUSCULAR | Status: AC
  Administered 2016-03-17: 0.5 mg via INTRAMUSCULAR

## 2016-03-17 MED ORDER — PROPRANOLOL HCL 10 MG PO TABS: 10.0000 mg | ORAL_TABLET | Freq: Two times a day (BID) | ORAL | Status: DC

## 2016-03-17 NOTE — Telephone Encounter (Addendum)
Pt called sts she is still having HA with shooting pain. Sts BP goes up when she is in pain and Dr Lucia GaskinsAhern talked about a BP. She is not sleeping also. Please call prior to 10:30 or after 3:00 pm

## 2016-03-17 NOTE — Progress Notes (Signed)
Gave dihydroergotamine Mesylate 0.5mg /41mL in left deltoid. Cleaned with alcohol wipe prior to injection. Band-aid applied. Pt tolerated well.   Gave dihydroergotamine Mesylate 0.5mg /501mL in right deltoid after first injection not effective per patient. She had no relief from injection. Cleaned with alcohol wipe prior to injection. Band-aid applied. Pt tolerated well.

## 2016-03-17 NOTE — Telephone Encounter (Signed)
Dr Lucia GaskinsAhern- please advise. You just saw pt on 03/13/16 and did nerve block on pt

## 2016-03-17 NOTE — Telephone Encounter (Signed)
Per Dr Lucia GaskinsAhern- we can bring pt in for DHE IM injection if pt willing.  Common SE: Dizziness, headache, N/V, diarrhea, flushing, increased sweating. More severe SE: irregular heartbeat, CP, confusion, weakness of one side of body, slurred speech, stomach pain, itching. Can increase risk for stroke per Dr Lucia GaskinsAhern.

## 2016-03-17 NOTE — Telephone Encounter (Signed)
Called pt back. Gabapentin is starting to help. She went up to 900 mg of gabapentin. She is having a problem sleeping. This is more of what is bothering her but she is still having shooting pain. She can't remember, but she thought Dr Lucia GaskinsAhern mentioned a BP med with some pain meds. Advised I will ask Dr Lucia GaskinsAhern. She is going to come today for DHE injection at 230pm. I went over common/more severe side effects. Pt aware. Husband will be bringing her to appt.

## 2016-03-17 NOTE — Telephone Encounter (Signed)
We can start a medication called nortriptyuline. Let me know when she is here and I will stop by the infusion room thanks

## 2016-03-17 NOTE — Telephone Encounter (Signed)
Pt was seen in office for f/u appt. See office note.

## 2016-03-18 ENCOUNTER — Telehealth: Payer: Self-pay | Admitting: *Deleted

## 2016-03-18 NOTE — Telephone Encounter (Signed)
Called and spoke to pt about lab results per Dr Lucia GaskinsAhern note. Pt verbalized understanding. I faxed copy of results to PCP per pt request.

## 2016-03-18 NOTE — Telephone Encounter (Signed)
-----   Message from Anson FretAntonia B Ahern, MD sent at 03/17/2016  6:53 PM EDT ----- She has stable anemia otherwise Labs unremarkable,

## 2016-03-18 NOTE — Progress Notes (Signed)
    SPHENOCATH PROCEDURE NOTE  History: Intractable migraine and cluster headache  Procedure: The patient was placed in the supine position. A temperature strip was added to the cheek area after the area was cleaned with alcohol. The Sphenocath was lubricated with gel, and placed in the right naris. The catheter was inserted above the middle turbinate to the posterior nasal cavity, and then withdrawn 1 cm. The catheter was deployed and rotated approximately 20 towards the nose. 2-1/2 mL of 2% lidocaine was deployed. The patient was asked to swallow during the injection. The patient demonstrated erythema of the sclera of the eye on this side, and an increase in the cheek temperature was noted from 92 F to 94 F.  This process was repeated on the left side, with similar results. The increase in cheek temperature was documented from 2394 F to 96 F.  The patient tolerated the procedure well. No complications of the procedure were noted. The patient was kept in the supine position for 8 minutes following the procedure. She was given small sips of water after sitting up following the procedure.  Nerve Block:  Lidocaine 2% Lot: [id number]  Expiration 07/2019  NDC: 08657-846-9663323-486-27  Anson FretAhern, Antonia B

## 2016-03-19 ENCOUNTER — Telehealth: Payer: Self-pay | Admitting: Neurology

## 2016-03-19 NOTE — Telephone Encounter (Signed)
Pt' granddaughter Verlon AuLeslie called inquiring about the type of injections pt had. She said the pt doesn't remember what she had. Please call.

## 2016-03-19 NOTE — Telephone Encounter (Signed)
Called pt granddaughter back. She is listed on DPR form. Advised on 7/6 pt had migraine infusion and nerve block (lidocaine 2%, marcaine 0.5%). On 7/10 she received IM DHE for migraine, and had sphenocath with lidocaine 2%. I advised her to go to [email or web address]. I explained procedure, but this gives a good overview of what this procedure is. Advised this provided the most relief for her. Advised pt stated DHE gave her no relief. Verlon AuLeslie wanted to know exactly what was given in migraine infusion. I placed her on hold to speak with Inetta Fermoina in intrafusion, but Inetta Fermoina had stepped away for a min. I advised I will call her back to let her know what was given after I speak with Inetta Fermoina who did the infusion. She verbalized understanding.   I advised pt next f/u is on 8/16 at 11am. She stated she will be coming to this appt, that she was not able to make the last couple.

## 2016-03-19 NOTE — Telephone Encounter (Signed)
Dr Ahern- FYI Called and spoke to granddaughter again, Verlon AuLeslie. Advised per Inetta Fermoina in intrafusion, pt was givLucia Gaskinsen Depacon 1gram, solumedrol 500mg , toradol 30mg , compazine 10mg . She wrote this down. I went over instructions for gabapentin with her. She states pt was a litte confused about this. Advised Dr Lucia GaskinsAhern wanted her to take 900mg  3x/day for one week and increase to 1200mg  3/day. She verbalized understanding and appreciation. She stated pt went to her PCP today because she was having trouble breathing. She was given another 80mg  toradol shot per Verlon AuLeslie. She is going to call husband to discuss with him. She has not talked to her mother yet to find out how she is feeling now. I advised Verlon AuLeslie that if she is still having trouble breathing/worseing sx she should go to urgent care or ER. She verbalized understanding.

## 2016-03-24 ENCOUNTER — Telehealth: Payer: Self-pay | Admitting: Neurology

## 2016-03-24 NOTE — Telephone Encounter (Signed)
I had a lengthy conversation with this pt. She has been taking gabapentin 300mg  capsule TID.(Dr. Lucia GaskinsAhern prescribed it to be taken 4 capsules TID but pt says that she has not made it "that high" yet). A couple days ago, she increased her gabapentin from 600mg  to 900mg  daily and she noticed that her voice has gotten very hoarse. Pt decided that she should start taking children's benadryl to help the hoarseness. Pt says that the gabapentin has "made my headaches go away". Pt does not want to stop taking the gabapentin, but does want advice on how to control the hoarseness. I advised pt that I was not sure if gabapentin caused hoarseness, but pt insisted that she looked it up, and it does cause hoarseness. I advised pt that Dr. Lucia GaskinsAhern is out of the office today and [facility]  Pt wants to know how to control the hoarseness on gabapentin (without stopping the medication), and if hoarseness is caused by gabapentin, and if she should take benadryl to help with the hoarseness.

## 2016-03-24 NOTE — Telephone Encounter (Signed)
I am unaware that gabapentin causes hoarseness, and I would like for the patient to continue the gabapentin. Hoarseness does not have to be a side effect of allergies and Benadryl will only help if an allergic reaction is the reason for the hoarseness. I would like to defer this until Dr. Lucia GaskinsAhern is back.CD

## 2016-03-24 NOTE — Telephone Encounter (Signed)
Patient is calling is regard to Rx gabapentin.  She states she has continually increased her dosage and is now @900  mg, however, she has become very hoarse.  She would like a call back to discuss.

## 2016-03-24 NOTE — Telephone Encounter (Signed)
Elon JesterMichele, I am out of the office this week. Can you call and ask patient to call back next week if she is still hoarse? I agree with Dr. Vickey Hugerohmeier, I don;t think this has anything to do with Gabapentin. But if it continues have her call me after Tuesday of next week.

## 2016-03-25 NOTE — Telephone Encounter (Signed)
I spoke to pt and advised her that Dr. Lucia GaskinsAhern and Dr. Vickey Hugerohmeier do not think the hoarseness is related to gabapentin and think the pt should continue taking the gabapentin. Pt is agreeable to calling Dr. Lucia GaskinsAhern on Tuesday if the hoarseness is not better. Pt says that the benadryl is not helping her hoarseness and will stop taking it. I encouraged pt to also discuss the hoarseness with her PCP and pt reports that she already has, and that office is giving her medication to add to her nebulizer. Pt verbalized understanding.

## 2016-04-23 ENCOUNTER — Ambulatory Visit: Payer: Medicare Other | Admitting: Neurology

## 2016-04-28 ENCOUNTER — Ambulatory Visit: Payer: Medicare Other | Admitting: Neurology

## 2016-07-24 ENCOUNTER — Telehealth: Payer: Self-pay | Admitting: Cardiology

## 2016-07-24 ENCOUNTER — Emergency Department (HOSPITAL_COMMUNITY): Payer: Medicare Other

## 2016-07-24 ENCOUNTER — Encounter (HOSPITAL_COMMUNITY): Payer: Self-pay | Admitting: Emergency Medicine

## 2016-07-24 ENCOUNTER — Emergency Department (HOSPITAL_COMMUNITY)
Admission: EM | Admit: 2016-07-24 | Discharge: 2016-07-24 | Disposition: A | Discharge status: Home / Self Care | Payer: Medicare Other | Attending: Emergency Medicine | Admitting: Emergency Medicine

## 2016-07-24 DIAGNOSIS — I10 Essential (primary) hypertension: Secondary | ICD-10-CM | POA: Insufficient documentation

## 2016-07-24 DIAGNOSIS — Z87891 Personal history of nicotine dependence: Secondary | ICD-10-CM | POA: Diagnosis not present

## 2016-07-24 DIAGNOSIS — J189 Pneumonia, unspecified organism: Secondary | ICD-10-CM | POA: Insufficient documentation

## 2016-07-24 DIAGNOSIS — R079 Chest pain, unspecified: Secondary | ICD-10-CM

## 2016-07-24 DIAGNOSIS — Z7952 Long term (current) use of systemic steroids: Secondary | ICD-10-CM | POA: Diagnosis not present

## 2016-07-24 DIAGNOSIS — Z79899 Other long term (current) drug therapy: Secondary | ICD-10-CM | POA: Diagnosis not present

## 2016-07-24 DIAGNOSIS — R0789 Other chest pain: Secondary | ICD-10-CM | POA: Diagnosis present

## 2016-07-24 HISTORY — DX: Essential (primary) hypertension: I10

## 2016-07-24 HISTORY — DX: Restless legs syndrome: G25.81

## 2016-07-24 LAB — CBC WITH DIFFERENTIAL/PLATELET
BASOS ABS: 0 10*3/uL (ref 0.0–0.1)
BASOS PCT: 0 %
EOS ABS: 0.1 10*3/uL (ref 0.0–0.7)
Eosinophils Relative: 1 %
HEMATOCRIT: 35.2 % — AB (ref 36.0–46.0)
HEMOGLOBIN: 11.1 g/dL — AB (ref 12.0–15.0)
Lymphocytes Relative: 13 %
Lymphs Abs: 1.1 10*3/uL (ref 0.7–4.0)
MCH: 27.2 pg (ref 26.0–34.0)
MCHC: 31.5 g/dL (ref 30.0–36.0)
MCV: 86.3 fL (ref 78.0–100.0)
MONOS PCT: 4 %
Monocytes Absolute: 0.4 10*3/uL (ref 0.1–1.0)
NEUTROS ABS: 6.9 10*3/uL (ref 1.7–7.7)
NEUTROS PCT: 82 %
Platelets: 297 10*3/uL (ref 150–400)
RBC: 4.08 MIL/uL (ref 3.87–5.11)
RDW: 13.6 % (ref 11.5–15.5)
WBC: 8.5 10*3/uL (ref 4.0–10.5)

## 2016-07-24 LAB — BASIC METABOLIC PANEL
ANION GAP: 8 (ref 5–15)
BUN: 12 mg/dL (ref 6–20)
CALCIUM: 8.9 mg/dL (ref 8.9–10.3)
CHLORIDE: 99 mmol/L — AB (ref 101–111)
CO2: 26 mmol/L (ref 22–32)
Creatinine, Ser: 0.91 mg/dL (ref 0.44–1.00)
GFR calc non Af Amer: 58 mL/min — ABNORMAL LOW (ref >60)
Glucose, Bld: 145 mg/dL — ABNORMAL HIGH (ref 65–99)
Potassium: 3.9 mmol/L (ref 3.5–5.1)
SODIUM: 133 mmol/L — AB (ref 135–145)

## 2016-07-24 LAB — I-STAT TROPONIN, ED
TROPONIN I, POC: 0 ng/mL (ref 0.00–0.08)
TROPONIN I, POC: 0 ng/mL (ref 0.00–0.08)

## 2016-07-24 MED ORDER — KETOROLAC TROMETHAMINE 30 MG/ML IJ SOLN
10.0000 mg | Freq: Once | INTRAMUSCULAR | Status: AC
  Administered 2016-07-24: 9.9 mg via INTRAVENOUS
  Filled 2016-07-24: qty 1

## 2016-07-24 NOTE — Discharge Instructions (Signed)
Continue take antibiotics. Your primary care doctor will need to follow-up on some abnormalities on the x-ray.

## 2016-07-24 NOTE — Telephone Encounter (Signed)
Crystal Lopez stating that she woke up at 430am with sharp pains in her chest radiating into her arm.  She took 2 Nitro at [employer]430am.  States that at the moment she is not having pain except in her back  but she is very weak.

## 2016-07-24 NOTE — ED Triage Notes (Signed)
Pt reports having sudden onset of chest pain this morning at 5am.  Was relieved some by [employer] x2.  Reports pain went through chest into back and down left arm.  Saw her pcp yesterday and dx with pneumonia and "given a shot."

## 2016-07-24 NOTE — ED Provider Notes (Signed)
[facility]-EM[facility]Y DEPT Provider Note   CSN: [id number] Arrival date & time: 07/24/16  1449     History   Chief Complaint Chief Complaint  Patient presents with  . Chest Pain    HPI Crystal Lopez is a 81 y.o. female.  HPI Patient has had respiratory symptoms for the last weeks. He's been seen in urgent care and had steroids. Yesterday was seen at her primary care doctor and had a shot of steroids and a shot of Rocephin. She is on antibiotics. States today she developed the chest pain. This pressure in the middle of her chest. States it felt like bricks came down a set of the chest. It was relieved after 2 nitros. States it went to her jaw into her arm. No diaphoresis. No nausea. She states she has a cough and a "rattle". She states it sounds like a "death rattle". States she knows what one sounds like because she has heard one before. No history of coronary artery disease. She has had previous ischemic workups at about negative. No swelling in her legs. No fevers. Patient is nervous because she has had several family members have had heart problems and pacemakers.   Past Medical History:  Diagnosis Date  . Aspirin allergy   . Chest discomfort    Previous negative ischemic workup  . GERD (gastroesophageal reflux disease)   . Hypertension   . Migraines   . Palpitations   . Restless leg   . Rib pain    March, 2012, injury from a fall  . Sleep apnea    Suspected diagnosis    Patient Active Problem List   Diagnosis Date Noted  . Intractable migraine 03/13/2016  . Sleep apnea   . Ejection fraction   . Aspirin allergy   . Palpitations   . GERD (gastroesophageal reflux disease)   . Chest discomfort   . Shortness of breath     Past Surgical History:  Procedure Laterality Date  . APPENDECTOMY    . EXPLORATORY LAPAROTOMY    . Hysterectomy    . L temple cut      OB History    No data available       Home Medications    Prior to Admission medications   Medication Sig  Start Date End Date Taking? Authorizing Provider  amitriptyline (ELAVIL) 10 MG tablet Take 1 tablet (10 mg total) by mouth at bedtime. 03/17/16  Yes Anson FretAntonia B Ahern, MD  ASTRAGALUS PO Take 1,000 mg by mouth daily.   Yes Historical Provider, MD  benzonatate (TESSALON) 100 MG capsule Take 100 mg by mouth daily as needed for cough.    Yes Historical Provider, MD  clonazePAM (KLONOPIN) 2 MG tablet Take 2 mg by mouth at bedtime.    Yes Historical Provider, MD  Cyanocobalamin (B-12 PO) Take 1 tablet by mouth daily.   Yes Historical Provider, MD  esomeprazole (NEXIUM) 20 MG capsule Take 20 mg by mouth daily as needed (FOR ACID REFLUX).   Yes Historical Provider, MD  fluticasone (FLONASE) 50 MCG/ACT nasal spray Place 1 spray into both nostrils daily as needed for allergies or rhinitis.   Yes Historical Provider, MD  gabapentin (NEURONTIN) 300 MG capsule Take 4 capsules (1,200 mg total) by mouth 3 (three) times daily. Patient taking differently: Take 600 mg by mouth 3 (three) times daily.  03/13/16  Yes Anson FretAntonia B Ahern, MD  Misc Natural Products (MAXIMUM MEMORY) TABS Take 1 tablet by mouth every morning. BRAIN STRONG MEMORY SUPPORT  Yes Historical Provider, MD  nitroGLYCERIN (NITROSTAT) 0.3 MG SL tablet Place 1 tablet (0.3 mg total) under the tongue every 5 (five) minutes as needed for chest pain. 02/10/14  Yes Luis AbedJeffrey D Katz, MD  Omega 3-6-9 Fatty Acids (TRIPLE OMEGA-3-6-9 PO) Take 1 capsule by mouth daily.    Yes Historical Provider, MD  predniSONE (DELTASONE) 10 MG tablet Take 10 mg by mouth daily with breakfast. 14 DAY COURSE STARTING ON 07/18/2016   Yes Historical Provider, MD  promethazine (PHENERGAN) 25 MG tablet Take 12.5 mg by mouth every 6 (six) hours as needed for nausea or vomiting.    Yes Historical Provider, MD  propranolol (INDERAL) 10 MG tablet Take 1 tablet (10 mg total) by mouth 2 (two) times daily. 03/17/16  Yes Anson FretAntonia B Ahern, MD  Pumpkin Seed-Soy Germ (AZO BLADDER CONTROL/GO-LESS) CAPS Take 1  tablet by mouth daily as needed (FOR OVERACTIVE BLADDER).    Yes Historical Provider, MD  ranitidine (ZANTAC) 150 MG tablet Take 150 mg by mouth as needed for heartburn.   Yes Historical Provider, MD    Family History Family History  Problem Relation Age of Onset  . Heart failure Mother   . Heart disease Father   . Heart failure Father   . Heart disease Sister   . Heart attack Sister   . Heart disease Brother   . Heart attack Brother   . Migraines Neg Hx     Social History Social History  Substance Use Topics  . Smoking status: Former Smoker    Packs/day: 2.00    Years: 7.00    Types: Cigarettes    Quit date: 09/08/1964  . Smokeless tobacco: Never Used  . Alcohol use No     Allergies   Codeine; Darvocet [propoxyphene n-acetaminophen]; Dilaudid [hydromorphone hcl]; Morphine and related; Boniva [ibandronate sodium]; Depakote er [divalproex sodium er]; Erythromycin; Levaquin [levofloxacin in d5w]; Macrodantin; Ropinirole hcl; Sulfa antibiotics; Tegretol [carbamazepine]; Topamax [topiramate]; and Aspirin   Review of Systems Review of Systems  Constitutional: Negative for appetite change.  HENT: Positive for congestion.   Respiratory: Positive for cough and shortness of breath.   Cardiovascular: Positive for chest pain.  Gastrointestinal: Negative for abdominal pain.  Genitourinary: Negative for dyspareunia.  Musculoskeletal: Negative for back pain.  Hematological: Negative for adenopathy.  Psychiatric/Behavioral: Negative for confusion.     Physical Exam Updated Vital Signs BP 148/75   Pulse 62   Temp 98.4 F (36.9 C) (Oral)   Resp 16   SpO2 97%   Physical Exam  Constitutional: She appears well-developed.  HENT:  Head: Normocephalic.  Eyes: Pupils are equal, round, and reactive to light.  Neck: No tracheal deviation present.  Cardiovascular: Normal rate.   Pulmonary/Chest:  Somewhat heart breath sounds with some diffuse rales.  Abdominal: Soft. There is no  tenderness.  Musculoskeletal: She exhibits no edema.  Neurological: She is alert.  Skin: Skin is warm. Capillary refill takes less than 2 seconds.  Psychiatric: She has a normal mood and affect.     ED Treatments / Results  Labs (all labs ordered are listed, but only abnormal results are displayed) Labs Reviewed  BASIC METABOLIC PANEL - Abnormal; Notable for the following:       Result Value   Sodium 133 (*)    Chloride 99 (*)    Glucose, Bld 145 (*)    GFR calc non Af Amer 58 (*)    All other components within normal limits  CBC WITH DIFFERENTIAL/PLATELET - Abnormal; Notable  for the following:    Hemoglobin 11.1 (*)    HCT 35.2 (*)    All other components within normal limits  Rosezena Sensor, ED  I-STAT TROPOININ, ED    EKG  EKG Interpretation  Date/Time:  Thursday July 24 2016 14:57:14 EST Ventricular Rate:  73 PR Interval:    QRS Duration: 86 QT Interval:  392 QTC Calculation: 432 R Axis:   17 Text Interpretation:  Sinus rhythm nonspecific inferior ST changes Confirmed by Rubin Payor  MD, Harrold Donath [id number]) on 07/24/2016 3:01:04 PM       Radiology Dg Chest 2 View  Result Date: 07/24/2016 CLINICAL DATA:  Onset of mid chest pressure radiating into the left arm this morning. Patient reports chronic cough and shortness of breath and palpitations. Remote history of smoking. EXAM: CHEST  2 VIEW COMPARISON:  None in PACs FINDINGS: The lungs are reasonably well inflated. There is linear increased density in the inferior aspect of the left upper lobe or adjacent to the minor fissure. Prominent thoracic kyphosis due to 2 mid thoracic vertebral wedge compressions limits excursion of the lungs. The heart is mildly enlarged. The pulmonary vascularity is not engorged. There is no pleural effusion. There is calcification in the wall of the aortic arch. IMPRESSION: COPD. Linear density in the right lung either in the minor fissure or in the inferior aspect of the upper lobe. This is  nonspecific and could reflect scarring. However, atelectasis or early pneumonia is not excluded. Correlation with the patient's clinical history and laboratory values is needed. Chest CT scanning may be a useful next imaging step. Cardiomegaly without pulmonary vascular congestion. Thoracic aortic atherosclerosis. Electronically Signed   By: David  [place] M.D.   On: 07/24/2016 15:44    Procedures Procedures (including critical care time)  Medications Ordered in ED Medications  ketorolac (TORADOL) 30 MG/ML injection 9.9 mg (9.9 mg Intravenous Given 07/24/16 1703)     Initial Impression / Assessment and Plan / ED Course  I have reviewed the triage vital signs and the nursing notes.  Pertinent labs & imaging results that were available during my care of the patient were reviewed by me and considered in my medical decision making (see chart for details).  Clinical Course     Patient with chest pain. Has had cough. Already on antibiotics for pneumonia. X-ray showed possible pneumonia. Will need follow-up to see that clears with antibiotics. Doubt cardiac cause of chest pain. Workup reassuring and his had previous ischemic workup is negative. Discharge home.  Final Clinical Impressions(s) / ED Diagnoses   Final diagnoses:  Chest pain, unspecified type  Community acquired pneumonia, unspecified laterality    New Prescriptions Discharge Medication List as of 07/24/2016  6:04 PM       Benjiman Core, MD 07/24/16 [phone]

## 2016-07-24 NOTE — Telephone Encounter (Signed)
[facility]  Presented at 2:49 this evening.

## 2016-10-10 ENCOUNTER — Encounter: Payer: Self-pay | Admitting: *Deleted

## 2016-10-13 ENCOUNTER — Ambulatory Visit: Payer: Medicare Other | Admitting: Cardiology

## 2016-10-13 NOTE — Progress Notes (Deleted)
Cardiology Office Note  Date: 10/13/2016   ID: Crystal Lopez, DOB 01-16-1935, MRN [id number]  PCP: Bedelia Person, MD  Primary Cardiologist: Nona Dell, MD   No chief complaint on file.   History of Present Illness: Crystal Lopez is a 82 y.o. female last seen in June 2017. She presents for a follow-up visit. Records indicate hospitalization at [facility] in December 2017 with chest discomfort. Symptoms are described as being atypical, troponin T levels were negative for ACS. Chest CTA was negative for pulmonary embolus or aortic dissection. Scattered aortic atherosclerosis was noted. Diffuse coronary artery calcifications were also seen  Past Medical History:  Diagnosis Date  . Aspirin allergy   . Chest discomfort    Previous negative ischemic workup  . GERD (gastroesophageal reflux disease)   . Hypertension   . Migraines   . Palpitations   . Restless leg   . Rib pain    March, 2012, injury from a fall  . Sleep apnea    Suspected diagnosis    Past Surgical History:  Procedure Laterality Date  . APPENDECTOMY    . EXPLORATORY LAPAROTOMY    . Hysterectomy    . L temple cut      Current Outpatient Prescriptions  Medication Sig Dispense Refill  . amitriptyline (ELAVIL) 10 MG tablet Take 1 tablet (10 mg total) by mouth at bedtime. 30 tablet 3  . ASTRAGALUS PO Take 1,000 mg by mouth daily.    . benzonatate (TESSALON) 100 MG capsule Take 100 mg by mouth daily as needed for cough.     . clonazePAM (KLONOPIN) 2 MG tablet Take 2 mg by mouth at bedtime.     . Cyanocobalamin (B-12 PO) Take 1 tablet by mouth daily.    Marland Kitchen esomeprazole (NEXIUM) 20 MG capsule Take 20 mg by mouth daily as needed (FOR ACID REFLUX).    . fluticasone (FLONASE) 50 MCG/ACT nasal spray Place 1 spray into both nostrils daily as needed for allergies or rhinitis.    Marland Kitchen gabapentin (NEURONTIN) 300 MG capsule Take 4 capsules (1,200 mg total) by mouth 3 (three) times daily. (Patient taking differently: Take 600 mg by  mouth 3 (three) times daily. ) 270 capsule 12  . Misc Natural Products (MAXIMUM MEMORY) TABS Take 1 tablet by mouth every morning. BRAIN STRONG MEMORY SUPPORT    . nitroGLYCERIN (NITROSTAT) 0.3 MG SL tablet Place 1 tablet (0.3 mg total) under the tongue every 5 (five) minutes as needed for chest pain. 25 tablet 3  . Omega 3-6-9 Fatty Acids (TRIPLE OMEGA-3-6-9 PO) Take 1 capsule by mouth daily.     . predniSONE (DELTASONE) 10 MG tablet Take 10 mg by mouth daily with breakfast. 14 DAY COURSE STARTING ON 07/18/2016    . promethazine (PHENERGAN) 25 MG tablet Take 12.5 mg by mouth every 6 (six) hours as needed for nausea or vomiting.     . propranolol (INDERAL) 10 MG tablet Take 1 tablet (10 mg total) by mouth 2 (two) times daily. 60 tablet 11  . Pumpkin Seed-Soy Germ (AZO BLADDER CONTROL/GO-LESS) CAPS Take 1 tablet by mouth daily as needed (FOR OVERACTIVE BLADDER).     . ranitidine (ZANTAC) 150 MG tablet Take 150 mg by mouth as needed for heartburn.     No current facility-administered medications for this visit.    Allergies:  Codeine; Darvocet [propoxyphene n-acetaminophen]; Dilaudid [hydromorphone hcl]; Morphine and related; Boniva [ibandronate sodium]; Depakote er [divalproex sodium er]; Erythromycin; Levaquin [levofloxacin in d5w]; Macrodantin; Ropinirole hcl; Sulfa  antibiotics; Tegretol [carbamazepine]; Topamax [topiramate]; and Aspirin   Social History: The patient  reports that she quit smoking about 52 years ago. Her smoking use included Cigarettes. She has a 14.00 pack-year smoking history. She has never used smokeless tobacco. She reports that she does not drink alcohol or use drugs.   Family History: The patient's family history includes Heart attack in her brother and sister; Heart disease in her brother, father, and sister; Heart failure in her father and mother.   ROS:  Please see the history of present illness. Otherwise, complete review of systems is positive for {NONE  DEFAULTED:18576::"none"}.  All other systems are reviewed and negative.   Physical Exam: VS:  There were no vitals taken for this visit., BMI There is no height or weight on file to calculate BMI.  Wt Readings from Last 3 Encounters:  03/13/16 105 lb (47.6 kg)  02/27/16 104 lb (47.2 kg)  11/12/15 105 lb 6.4 oz (47.8 kg)    General: Thin eldaerly woman, appears comfortable at rest. HEENT: Conjunctiva and lids normal, oropharynx clear. Neck: Supple, no elevated JVP or carotid bruits, no thyromegaly. Lungs: Clear to auscultation, nonlabored breathing at rest. Cardiac: Regular rate and rhythm, no S3 or significant systolic murmur, no pericardial rub. Abdomen: Soft, nontender, bowel sounds present. Extremities: No pitting edema, distal pulses 2+. Musculoskeletal: No kyphosis. Neuropsychiatric: Alert and oriented 3, affect appropriate.  ECG: I personally reviewed the tracing from 09/02/2016 which showed normal sinus rhythm.  Recent Labwork: 03/13/2016: ALT 9; AST 15 07/24/2016: BUN 12; Creatinine, Ser 0.91; Hemoglobin 11.1; Platelets 297; Potassium 3.9; Sodium 133  Other Studies Reviewed Today:  Chest CTA 09/02/2016 [facility]): No aortic dissection, no aortic aneurysm, no pulmonary embolus, minimal right upper lobe airspace opacity, scattered aortic atherosclerosis, left atrial enlargement, diffuse coronary artery calcifications, distention of the esophagus with fluid suggestive of dysmotility or GERD, 2.8 cm hepatic cyst, chronic thoracic compression deformities.  Echocardiogram 02/21/2015: Study Conclusions  - Left ventricle: The cavity size was normal. Wall thickness was normal. The estimated ejection fraction was 60%. Wall motion was normal; there were no regional wall motion abnormalities. - Aortic valve: There was trivial regurgitation. - Right ventricle: The cavity size was normal. Systolic function was normal.  Assessment and Plan:    Current medicines were reviewed  with the patient today.  No orders of the defined types were placed in this encounter.   Disposition:  Signed, Jonelle SidleSamuel G. McDowell, MD, [facility]mithFACC 10/13/2016 8:30 AM    [facility] [address], [place], [place] [address] Phone: [phone]; Fax: [phone]

## 2016-11-17 ENCOUNTER — Ambulatory Visit: Payer: Medicare Other | Admitting: Cardiology

## 2016-12-04 ENCOUNTER — Telehealth: Payer: Self-pay | Admitting: Physician Assistant

## 2016-12-04 ENCOUNTER — Telehealth: Payer: Self-pay | Admitting: Cardiology

## 2016-12-04 NOTE — Telephone Encounter (Signed)
Patient called stating that she has been having swelling from her knees to the ankles. States that she has been having chest pains off and on for a week now. She has been taking Nitro since yesterday. States that she passed out yesterday trying to get to the bathroom. States that she is weak. Please call [phone] or [phone].

## 2016-12-04 NOTE — Telephone Encounter (Signed)
Stated she has been having chest pains off / on x 2-3 days.  Initial episode was rated 7/10.  Did take NTG x 1 & gid get relief.  Also had episode the next morning 6-8/10, did take another NTG and did get relief.  Does c/o SOB with episode and does seem to more with exertion.  Questioned about why she didn't go to emergency room & stated that she just didn't want to go to that hospital again [facility]  Looks like patient does have OV scheduled for tomorrow with Dr. Wyline [place]anch [place].  Advised to not to hesitate to go to ED in the meantime if symptoms worsen.  [facility].  Patient verbalized understanding.

## 2016-12-04 NOTE — Telephone Encounter (Signed)
    Granddaughter calling because she has been having chest pain and LE edema. LE edema is a new problem. Review of previous notes and studies show that she has had a normal cardiac work up with normal ECHO. It looks like she has chronic chest pain and no further work up was planned at last OP visit. She asked for an appointment sooner than 2 weeks with Dr. Diona BrownerMcDowell. They asked to be seen tomorrow. Only available appointment with with Dr. [place]yline [place]anch [place]. They were thankful for appointment.  Cline CrockKathryn Thompson PA-C  [facility]

## 2016-12-05 ENCOUNTER — Encounter: Payer: Self-pay | Admitting: Cardiology

## 2016-12-05 ENCOUNTER — Telehealth: Payer: Self-pay | Admitting: *Deleted

## 2016-12-05 ENCOUNTER — Ambulatory Visit (INDEPENDENT_AMBULATORY_CARE_PROVIDER_SITE_OTHER): Payer: Medicare Other | Admitting: Cardiology

## 2016-12-05 VITALS — BP 128/70 | HR 77 | Ht 59.0 in | Wt 110.0 lb

## 2016-12-05 DIAGNOSIS — R600 Localized edema: Secondary | ICD-10-CM

## 2016-12-05 DIAGNOSIS — R079 Chest pain, unspecified: Secondary | ICD-10-CM

## 2016-12-05 DIAGNOSIS — R0602 Shortness of breath: Secondary | ICD-10-CM | POA: Diagnosis not present

## 2016-12-05 MED ORDER — FUROSEMIDE 20 MG PO TABS: 20.0000 mg | ORAL_TABLET | Freq: Every day | ORAL | 3 refills | Status: DC | PRN

## 2016-12-05 NOTE — Progress Notes (Signed)
Clinical Summary Crystal Lopez is a 82 y.o.female regular patient of Dr Diona Browner. She is added on today for the recent symptoms  1. Chest pain - from prior notes she has had prior atypical chest pain symptoms. Notes mention several year history with negative ischemic workups.  - echo 02/2015 LVEF 60%, no WMAs. Diastolic function not reported, E/A ratio 0.7 supports grade I - Cardiolite done at [facility] in November 2008 which was negative for ischemia with LVEF 70%.   -left sided pain under left breast, radiated to neck and shoulder. Sharp pain, 9/10 in severity. Can occur at rest. +SOB. Worst with deep breath, tender to palpation. Unsure how long it lasts. Increase in frequency. Can have some palpitations.  - can be better with NG. Severe SOB with episodes. - this pain is different from pain.    2. Palpitations - long term history - started on beta blocker last week by pcp due to irregular rhythm at that time.  - recent severe palpitations  - 1 cup of coffee day.  - taking Toprol XL only  daily. Palpitations are improved.   3. LE edema - started 3-4 days, bilateral. +orthopnea x 2 pillow since Friday. Has to sleep in recliner at times.     Past Medical History:  Diagnosis Date  . Aspirin allergy   . Chest discomfort    Previous negative ischemic workup  . GERD (gastroesophageal reflux disease)   . Hypertension   . Migraines   . Palpitations   . Restless leg   . Rib pain    March, 2012, injury from a fall  . Sleep apnea    Suspected diagnosis     Allergies  Allergen Reactions  . Codeine Hives, Shortness Of Breath and Swelling  . Darvocet [Propoxyphene N-Acetaminophen] Hives, Shortness Of Breath and Swelling  . Dilaudid [Hydromorphone Hcl] Hives, Shortness Of Breath and Swelling  . Morphine And Related Hives, Shortness Of Breath and Swelling  . Boniva [Ibandronate Sodium] Nausea And Vomiting  . Depakote Er [Divalproex Sodium Er] Nausea And Vomiting  .  Erythromycin Other (See Comments)    UTI  . Levaquin [Levofloxacin In D5w] Nausea And Vomiting  . Macrodantin Nausea And Vomiting  . Ropinirole Hcl Other (See Comments)    insomnia  . Sulfa Antibiotics Hives, Nausea And Vomiting and Swelling  . Tegretol [Carbamazepine] Nausea And Vomiting  . Topamax [Topiramate] Hives and Swelling  . Aspirin     Stomach irritation     Current Outpatient Prescriptions  Medication Sig Dispense Refill  . amitriptyline (ELAVIL) 10 MG tablet Take 1 tablet (10 mg total) by mouth at bedtime. 30 tablet 3  . ASTRAGALUS PO Take 1,000 mg by mouth daily.    . benzonatate (TESSALON) 100 MG capsule Take 100 mg by mouth daily as needed for cough.     . clonazePAM (KLONOPIN) 2 MG tablet Take 2 mg by mouth at bedtime.     . Cyanocobalamin (B-12 PO) Take 1 tablet by mouth daily.    Marland Kitchen esomeprazole (NEXIUM) 20 MG capsule Take 20 mg by mouth daily as needed (FOR ACID REFLUX).    . fluticasone (FLONASE) 50 MCG/ACT nasal spray Place 1 spray into both nostrils daily as needed for allergies or rhinitis.    Marland Kitchen gabapentin (NEURONTIN) 300 MG capsule Take 4 capsules (1,200 mg total) by mouth 3 (three) times daily. (Patient taking differently: Take 600 mg by mouth 3 (three) times daily. ) 270 capsule 12  . Misc  Natural Products (MAXIMUM MEMORY) TABS Take 1 tablet by mouth every morning. BRAIN STRONG MEMORY SUPPORT    . nitroGLYCERIN (NITROSTAT) 0.3 MG SL tablet Place 1 tablet (0.3 mg total) under the tongue every 5 (five) minutes as needed for chest pain. 25 tablet 3  . Omega 3-6-9 Fatty Acids (TRIPLE OMEGA-3-6-9 PO) Take 1 capsule by mouth daily.     . predniSONE (DELTASONE) 10 MG tablet Take 10 mg by mouth daily with breakfast. 14 DAY COURSE STARTING ON 07/18/2016    . promethazine (PHENERGAN) 25 MG tablet Take 12.5 mg by mouth every 6 (six) hours as needed for nausea or vomiting.     . propranolol (INDERAL) 10 MG tablet Take 1 tablet (10 mg total) by mouth 2 (two) times daily. 60  tablet 11  . Pumpkin Seed-Soy Germ (AZO BLADDER CONTROL/GO-LESS) CAPS Take 1 tablet by mouth daily as needed (FOR OVERACTIVE BLADDER).     . ranitidine (ZANTAC) 150 MG tablet Take 150 mg by mouth as needed for heartburn.     No current facility-administered medications for this visit.      Past Surgical History:  Procedure Laterality Date  . APPENDECTOMY    . EXPLORATORY LAPAROTOMY    . Hysterectomy    . L temple cut       Allergies  Allergen Reactions  . Codeine Hives, Shortness Of Breath and Swelling  . Darvocet [Propoxyphene N-Acetaminophen] Hives, Shortness Of Breath and Swelling  . Dilaudid [Hydromorphone Hcl] Hives, Shortness Of Breath and Swelling  . Morphine And Related Hives, Shortness Of Breath and Swelling  . Boniva [Ibandronate Sodium] Nausea And Vomiting  . Depakote Er [Divalproex Sodium Er] Nausea And Vomiting  . Erythromycin Other (See Comments)    UTI  . Levaquin [Levofloxacin In D5w] Nausea And Vomiting  . Macrodantin Nausea And Vomiting  . Ropinirole Hcl Other (See Comments)    insomnia  . Sulfa Antibiotics Hives, Nausea And Vomiting and Swelling  . Tegretol [Carbamazepine] Nausea And Vomiting  . Topamax [Topiramate] Hives and Swelling  . Aspirin     Stomach irritation      Family History  Problem Relation Age of Onset  . Heart failure Mother   . Heart disease Father   . Heart failure Father   . Heart disease Sister   . Heart attack Sister   . Heart disease Brother   . Heart attack Brother   . Migraines Neg Hx      Social History Ms. Charlie reports that she quit smoking about 52 years ago. Her smoking use included Cigarettes. She has a 14.00 pack-year smoking history. She has never used smokeless tobacco. Ms. Defilippo reports that she does not drink alcohol.   Review of Systems CONSTITUTIONAL: No weight loss, fever, chills, weakness or fatigue.  HEENT: Eyes: No visual loss, blurred vision, double vision or yellow sclerae.No hearing loss,  sneezing, congestion, runny nose or sore throat.  SKIN: No rash or itching.  CARDIOVASCULAR: per hpi RESPIRATORY: per hpi GASTROINTESTINAL: No anorexia, nausea, vomiting or diarrhea. No abdominal pain or blood.  GENITOURINARY: No burning on urination, no polyuria NEUROLOGICAL: No headache, dizziness, syncope, paralysis, ataxia, numbness or tingling in the extremities. No change in bowel or bladder control.  MUSCULOSKELETAL: No muscle, back pain, joint pain or stiffness.  LYMPHATICS: No enlarged nodes. No history of splenectomy.  PSYCHIATRIC: No history of depression or anxiety.  ENDOCRINOLOGIC: No reports of sweating, cold or heat intolerance. No polyuria or polydipsia.  Marland Kitchen   Physical Examination  Vitals:   12/05/16 1353  BP: 128/70  Pulse: 77   Vitals:   12/05/16 1353  Weight: 110 lb (49.9 kg)  Height:  (1.499 m)    Gen: resting comfortably, no acute distress HEENT: no scleral icterus, pupils equal round and reactive, no palptable cervical adenopathy,  CV: RRR, no m/r/g no jvd Resp: Clear to auscultation bilaterally GI: abdomen is soft, non-tender, non-distended, normal bowel sounds, no hepatosplenomegaly MSK: extremities are warm, no edema.  Skin: warm, no rash Neuro:  no focal deficits Psych: appropriate affect   Diagnostic Studies     Assessment and Plan   1. Chest pain - long history of symptoms according to notes, previous negative ischemic evaluation - current symptoms are atypical. Will not pursue iscehmic testing at this time. GIven her reported LE and orthopnea, will obtain echo. If abnormal could consider ishcemic testing at that time - EKG in clinic SR, no ischemic changes.   2. Palpitaitons - long history. Recently started on Toprol by pcp with improved symptoms. Continue to monitor  3. LE edema/orthopnea - repeat echo - start  Lasix  prn   F/u Dr Diona Browner in 2 weeks  Antoine Poche, M.D.

## 2016-12-05 NOTE — Telephone Encounter (Signed)
Granddaughter informed that her appointment for today was scheduled for the [place] office, not [place] like I had mentioned in note last evening.  She typically does come here, but had openings today in [place].  Just wanted to make sure there was no confusion on that appointment.  Granddaughter appreciative of the call.

## 2016-12-05 NOTE — Patient Instructions (Signed)
Medication Instructions:  Start lasix 20 mg daily as needed for swelling   Labwork: none  Testing/Procedures: Your physician has requested that you have an echocardiogram. Echocardiography is a painless test that uses sound waves to create images of your heart. It provides your doctor with information about the size and shape of your heart and how well your heart's chambers and valves are working. This procedure takes approximately one hour. There are no restrictions for this procedure.    Follow-Up: Your physician recommends that you schedule a follow-up appointment in: 2 weeks with Dr. Diona Browner    Any Other Special Instructions Will Be Listed Below (If Applicable).     If you need a refill on your cardiac medications before your next appointment, please call your pharmacy.

## 2016-12-11 ENCOUNTER — Ambulatory Visit (HOSPITAL_BASED_OUTPATIENT_CLINIC_OR_DEPARTMENT_OTHER)
Admission: RE | Admit: 2016-12-11 | Discharge: 2016-12-11 | Disposition: A | Discharge status: Home / Self Care | Payer: Medicare Other | Source: Ambulatory Visit | Attending: Cardiology | Admitting: Cardiology

## 2016-12-11 DIAGNOSIS — I351 Nonrheumatic aortic (valve) insufficiency: Secondary | ICD-10-CM

## 2016-12-11 DIAGNOSIS — R0602 Shortness of breath: Secondary | ICD-10-CM | POA: Insufficient documentation

## 2016-12-11 DIAGNOSIS — N390 Urinary tract infection, site not specified: Secondary | ICD-10-CM | POA: Diagnosis not present

## 2016-12-11 DIAGNOSIS — J189 Pneumonia, unspecified organism: Secondary | ICD-10-CM | POA: Diagnosis not present

## 2016-12-11 LAB — ECHOCARDIOGRAM COMPLETE
E decel time: 268 msec
E/e' ratio: 8.93
FS: 32 % (ref 28–44)
IVS/LV PW RATIO, ED: 0.94
LA ID, A-P, ES: 27 mm
LA diam end sys: 27 mm
LA diam index: 1.89 cm/m2
LA vol A4C: 42.4 ml
LA vol index: 30 mL/m2
LA vol: 42.9 mL
LV E/e' medial: 8.93
LV E/e'average: 8.93
LV PW d: 12 mm — AB (ref 0.6–1.1)
LV e' LATERAL: 7.51 cm/s
LV sys vol index: 14 mL/m2
LV sys vol: 21 mL
LVOT SV: 62 mL
LVOT VTI: 21.8 cm
LVOT area: 2.84 cm2
LVOT diameter: 19 mm
LVOT peak grad rest: 4 mmHg
LVOT peak vel: 104 cm/s
Lateral S' vel: 10.3 cm/s
MV Dec: 268
MV pk A vel: 92.5 m/s
MV pk E vel: 67.1 m/s
P 1/2 time: 518 ms
RV sys press: 26 mmHg
Reg peak vel: 241 cm/s
TAPSE: 16.3 mm
TDI e' lateral: 7.51
TDI e' medial: 5.87
TR max vel: 241 cm/s

## 2016-12-11 NOTE — Progress Notes (Signed)
*  PRELIMINARY RESULTS* Echocardiogram 2D Echocardiogram has been performed.  Stacey Drain 12/11/2016, 11:34 AM

## 2016-12-13 ENCOUNTER — Encounter (HOSPITAL_COMMUNITY): Payer: Self-pay | Admitting: Emergency Medicine

## 2016-12-13 ENCOUNTER — Inpatient Hospital Stay (HOSPITAL_COMMUNITY)
Admission: EM | Admit: 2016-12-13 | Discharge: 2016-12-16 | DRG: 193 | Disposition: A | Discharge status: Home / Self Care | Payer: Medicare Other | Attending: Internal Medicine | Admitting: Internal Medicine

## 2016-12-13 ENCOUNTER — Emergency Department (HOSPITAL_COMMUNITY): Payer: Medicare Other

## 2016-12-13 DIAGNOSIS — R319 Hematuria, unspecified: Secondary | ICD-10-CM

## 2016-12-13 DIAGNOSIS — N390 Urinary tract infection, site not specified: Secondary | ICD-10-CM

## 2016-12-13 DIAGNOSIS — Z888 Allergy status to other drugs, medicaments and biological substances status: Secondary | ICD-10-CM

## 2016-12-13 DIAGNOSIS — Z87891 Personal history of nicotine dependence: Secondary | ICD-10-CM

## 2016-12-13 DIAGNOSIS — Z8249 Family history of ischemic heart disease and other diseases of the circulatory system: Secondary | ICD-10-CM

## 2016-12-13 DIAGNOSIS — G4730 Sleep apnea, unspecified: Secondary | ICD-10-CM | POA: Diagnosis present

## 2016-12-13 DIAGNOSIS — K219 Gastro-esophageal reflux disease without esophagitis: Secondary | ICD-10-CM | POA: Diagnosis present

## 2016-12-13 DIAGNOSIS — Z79899 Other long term (current) drug therapy: Secondary | ICD-10-CM

## 2016-12-13 DIAGNOSIS — J189 Pneumonia, unspecified organism: Secondary | ICD-10-CM

## 2016-12-13 DIAGNOSIS — Z9049 Acquired absence of other specified parts of digestive tract: Secondary | ICD-10-CM

## 2016-12-13 DIAGNOSIS — W19XXXA Unspecified fall, initial encounter: Secondary | ICD-10-CM | POA: Diagnosis present

## 2016-12-13 DIAGNOSIS — J181 Lobar pneumonia, unspecified organism: Secondary | ICD-10-CM | POA: Diagnosis not present

## 2016-12-13 DIAGNOSIS — G9341 Metabolic encephalopathy: Secondary | ICD-10-CM

## 2016-12-13 DIAGNOSIS — M25531 Pain in right wrist: Secondary | ICD-10-CM | POA: Diagnosis present

## 2016-12-13 DIAGNOSIS — R296 Repeated falls: Secondary | ICD-10-CM

## 2016-12-13 DIAGNOSIS — Z90710 Acquired absence of both cervix and uterus: Secondary | ICD-10-CM

## 2016-12-13 DIAGNOSIS — Z8701 Personal history of pneumonia (recurrent): Secondary | ICD-10-CM

## 2016-12-13 DIAGNOSIS — Z886 Allergy status to analgesic agent status: Secondary | ICD-10-CM

## 2016-12-13 DIAGNOSIS — R911 Solitary pulmonary nodule: Secondary | ICD-10-CM

## 2016-12-13 DIAGNOSIS — Z885 Allergy status to narcotic agent status: Secondary | ICD-10-CM

## 2016-12-13 DIAGNOSIS — Z7951 Long term (current) use of inhaled steroids: Secondary | ICD-10-CM

## 2016-12-13 DIAGNOSIS — Z882 Allergy status to sulfonamides status: Secondary | ICD-10-CM

## 2016-12-13 DIAGNOSIS — J9811 Atelectasis: Secondary | ICD-10-CM | POA: Diagnosis present

## 2016-12-13 DIAGNOSIS — I119 Hypertensive heart disease without heart failure: Secondary | ICD-10-CM | POA: Diagnosis present

## 2016-12-13 LAB — CBC WITH DIFFERENTIAL/PLATELET
BASOS PCT: 0 %
Basophils Absolute: 0 10*3/uL (ref 0.0–0.1)
EOS ABS: 0 10*3/uL (ref 0.0–0.7)
Eosinophils Relative: 0 %
HEMATOCRIT: 35 % — AB (ref 36.0–46.0)
HEMOGLOBIN: 11.3 g/dL — AB (ref 12.0–15.0)
LYMPHS ABS: 0.7 10*3/uL (ref 0.7–4.0)
Lymphocytes Relative: 6 %
MCH: 27.8 pg (ref 26.0–34.0)
MCHC: 32.3 g/dL (ref 30.0–36.0)
MCV: 86.2 fL (ref 78.0–100.0)
MONO ABS: 0.5 10*3/uL (ref 0.1–1.0)
MONOS PCT: 4 %
NEUTROS PCT: 90 %
Neutro Abs: 10.6 10*3/uL — ABNORMAL HIGH (ref 1.7–7.7)
Platelets: 342 10*3/uL (ref 150–400)
RBC: 4.06 MIL/uL (ref 3.87–5.11)
RDW: 13 % (ref 11.5–15.5)
WBC: 11.9 10*3/uL — ABNORMAL HIGH (ref 4.0–10.5)

## 2016-12-13 LAB — URINALYSIS, ROUTINE W REFLEX MICROSCOPIC
Bilirubin Urine: NEGATIVE
Glucose, UA: NEGATIVE mg/dL
Ketones, ur: NEGATIVE mg/dL
NITRITE: POSITIVE — AB
PROTEIN: NEGATIVE mg/dL
SPECIFIC GRAVITY, URINE: 1.013 (ref 1.005–1.030)
pH: 7 (ref 5.0–8.0)

## 2016-12-13 LAB — COMPREHENSIVE METABOLIC PANEL
ALT: 10 U/L — ABNORMAL LOW (ref 14–54)
ANION GAP: 9 (ref 5–15)
AST: 21 U/L (ref 15–41)
Albumin: 3.9 g/dL (ref 3.5–5.0)
Alkaline Phosphatase: 31 U/L — ABNORMAL LOW (ref 38–126)
BILIRUBIN TOTAL: 0.6 mg/dL (ref 0.3–1.2)
BUN: 9 mg/dL (ref 6–20)
CALCIUM: 9.5 mg/dL (ref 8.9–10.3)
CO2: 29 mmol/L (ref 22–32)
Chloride: 98 mmol/L — ABNORMAL LOW (ref 101–111)
Creatinine, Ser: 0.86 mg/dL (ref 0.44–1.00)
GFR calc non Af Amer: >60 mL/min (ref >60)
GLUCOSE: 131 mg/dL — AB (ref 65–99)
POTASSIUM: 4.2 mmol/L (ref 3.5–5.1)
Sodium: 136 mmol/L (ref 135–145)
TOTAL PROTEIN: 7.8 g/dL (ref 6.5–8.1)

## 2016-12-13 LAB — AMMONIA: Ammonia: 7 umol/L — ABNORMAL LOW (ref 9–35)

## 2016-12-13 LAB — LACTIC ACID, PLASMA
Lactic Acid, Venous: 0.7 mmol/L (ref 0.5–1.9)
Lactic Acid, Venous: 0.7 mmol/L (ref 0.5–1.9)

## 2016-12-13 MED ORDER — BENZONATATE 100 MG PO CAPS: 100.0000 mg | ORAL_CAPSULE | Freq: Every day | ORAL | Status: DC | PRN

## 2016-12-13 MED ORDER — ENOXAPARIN SODIUM 40 MG/0.4ML ~~LOC~~ SOLN
40.0000 mg | SUBCUTANEOUS | Status: DC
  Administered 2016-12-14 – 2016-12-16 (×3): 40 mg via SUBCUTANEOUS
  Filled 2016-12-13 (×3): qty 0.4

## 2016-12-13 MED ORDER — SODIUM CHLORIDE 0.9 % IV BOLUS (SEPSIS)
500.0000 mL | Freq: Once | INTRAVENOUS | Status: AC
  Administered 2016-12-13: 500 mL via INTRAVENOUS

## 2016-12-13 MED ORDER — SODIUM CHLORIDE 0.9 % IV SOLN
INTRAVENOUS | Status: DC
  Administered 2016-12-13: 23:00:00 via INTRAVENOUS

## 2016-12-13 MED ORDER — HYDROCODONE-ACETAMINOPHEN 5-325 MG PO TABS
1.0000 | ORAL_TABLET | Freq: Four times a day (QID) | ORAL | Status: DC | PRN
  Administered 2016-12-14 – 2016-12-16 (×9): 1 via ORAL
  Filled 2016-12-13 (×9): qty 1

## 2016-12-13 MED ORDER — HYDROCODONE-ACETAMINOPHEN 5-325 MG PO TABS
1.0000 | ORAL_TABLET | Freq: Once | ORAL | Status: AC
  Administered 2016-12-13: 1 via ORAL
  Filled 2016-12-13: qty 1

## 2016-12-13 MED ORDER — METOPROLOL SUCCINATE ER 25 MG PO TB24
25.0000 mg | ORAL_TABLET | Freq: Every day | ORAL | Status: DC
  Administered 2016-12-14 – 2016-12-16 (×3): 25 mg via ORAL
  Filled 2016-12-13 (×3): qty 1

## 2016-12-13 MED ORDER — FAMOTIDINE 20 MG PO TABS
20.0000 mg | ORAL_TABLET | Freq: Every day | ORAL | Status: DC
  Administered 2016-12-14 – 2016-12-16 (×3): 20 mg via ORAL
  Filled 2016-12-13 (×3): qty 1

## 2016-12-13 MED ORDER — GABAPENTIN 300 MG PO CAPS
900.0000 mg | ORAL_CAPSULE | Freq: Three times a day (TID) | ORAL | Status: DC
  Administered 2016-12-13 – 2016-12-14 (×2): 900 mg via ORAL
  Administered 2016-12-14 (×2): 300 mg via ORAL
  Filled 2016-12-13 (×5): qty 3

## 2016-12-13 MED ORDER — ONDANSETRON HCL 4 MG/2ML IJ SOLN
4.0000 mg | Freq: Four times a day (QID) | INTRAMUSCULAR | Status: DC | PRN
  Administered 2016-12-13 – 2016-12-16 (×8): 4 mg via INTRAVENOUS
  Filled 2016-12-13 (×8): qty 2

## 2016-12-13 MED ORDER — PANTOPRAZOLE SODIUM 40 MG PO TBEC
40.0000 mg | DELAYED_RELEASE_TABLET | Freq: Every day | ORAL | Status: DC
  Administered 2016-12-14 – 2016-12-16 (×3): 40 mg via ORAL
  Filled 2016-12-13 (×3): qty 1

## 2016-12-13 MED ORDER — DEXTROSE 5 % IV SOLN
1.0000 g | INTRAVENOUS | Status: DC
  Administered 2016-12-14 – 2016-12-15 (×2): 1 g via INTRAVENOUS
  Filled 2016-12-13 (×3): qty 10

## 2016-12-13 MED ORDER — AMITRIPTYLINE HCL 10 MG PO TABS
10.0000 mg | ORAL_TABLET | Freq: Every day | ORAL | Status: DC
  Administered 2016-12-13 – 2016-12-15 (×3): 10 mg via ORAL
  Filled 2016-12-13 (×3): qty 1

## 2016-12-13 MED ORDER — ONDANSETRON HCL 4 MG PO TABS
4.0000 mg | ORAL_TABLET | Freq: Once | ORAL | Status: AC
  Administered 2016-12-13: 4 mg via ORAL
  Filled 2016-12-13: qty 1

## 2016-12-13 MED ORDER — CLONAZEPAM 0.5 MG PO TABS
2.0000 mg | ORAL_TABLET | Freq: Every day | ORAL | Status: DC
  Administered 2016-12-13 – 2016-12-15 (×3): 2 mg via ORAL
  Filled 2016-12-13 (×3): qty 4

## 2016-12-13 MED ORDER — DOXYCYCLINE HYCLATE 100 MG PO TABS
100.0000 mg | ORAL_TABLET | Freq: Two times a day (BID) | ORAL | Status: DC
  Administered 2016-12-13 – 2016-12-16 (×6): 100 mg via ORAL
  Filled 2016-12-13 (×6): qty 1

## 2016-12-13 MED ORDER — SODIUM CHLORIDE 0.9 % IV BOLUS (SEPSIS)
1000.0000 mL | Freq: Once | INTRAVENOUS | Status: AC
  Administered 2016-12-13: 1000 mL via INTRAVENOUS

## 2016-12-13 MED ORDER — DEXTROSE 5 % IV SOLN
1.0000 g | Freq: Once | INTRAVENOUS | Status: AC
  Administered 2016-12-13: 1 g via INTRAVENOUS
  Filled 2016-12-13: qty 10

## 2016-12-13 NOTE — ED Notes (Signed)
Bruising note to nose, with swelling  Noted to R wrist, and hand sensatino intact- pt denies headache, or recent illness, burning with urinatiion

## 2016-12-13 NOTE — ED Notes (Signed)
Pt reports that family lives with her- that she has fallen three times since last Sunday- She has bruising to her nose , swelling and pain to her R hand and elbow  Physician is Dr Parke Simmers, Cardiologist Dr Wyline Mood  Here with family

## 2016-12-13 NOTE — ED Notes (Signed)
Patient transported to CT 

## 2016-12-13 NOTE — ED Notes (Signed)
Pt continues to be alert with complaint of wrist pain- denies other complaints

## 2016-12-13 NOTE — ED Notes (Signed)
Call for report- delayed as RN who will receive does not know she is getting a patient

## 2016-12-13 NOTE — ED Notes (Signed)
To radiology via stretcher.

## 2016-12-13 NOTE — ED Notes (Signed)
Query to provider regarding antibiotic

## 2016-12-13 NOTE — ED Notes (Signed)
Attempt to call report - RN will call back

## 2016-12-13 NOTE — ED Provider Notes (Signed)
[facility]EMER[facility]ENCY DEPT Provider Note   CSN: [id number] Arrival date & time: 12/13/16  1605     History   Chief Complaint Chief Complaint  Patient presents with  . Altered Mental Status    HPI Sonya Gunnoe is a 82 y.o. female.  Patient is a 82 year old female who presents to the emergency department with family with a complaint of altered mental status.  The family gives history that the patient has had about 3 falls within the past week. The patient has been confused most of the week. The patient sustained an injury to the face. On yesterday April 6 the patient injured the left wrist. The family also reports that the patient has been weaker than usual. She is usually on independent to some degree. At this point she cannot stand without assistance. They also report that the patient is not eating and drinking consistently. Patient was evaluated by the primary physician. Patient was also evaluated by cardiology. Cardiology reports that these symptoms are not related to the heart. The ejection fraction is 60%. No other acute changes noted on echo or cardiology evaluation. No reported fever. There's been no reported diarrhea or vomiting. No unusual rash appreciated. The family does report that when the patient goes to bathroom, she only goes a few drops at the time. No recent hospitalization reported. Patient presents now for evaluation of these issues.      Past Medical History:  Diagnosis Date  . Aspirin allergy   . Chest discomfort    Previous negative ischemic workup  . GERD (gastroesophageal reflux disease)   . Hypertension   . Migraines   . Palpitations   . Restless leg   . Rib pain    March, 2012, injury from a fall  . Sleep apnea    Suspected diagnosis    Patient Active Problem List   Diagnosis Date Noted  . Intractable migraine 03/13/2016  . Sleep apnea   . Ejection fraction   . Aspirin allergy   . Palpitations   . GERD (gastroesophageal reflux disease)   . Chest  discomfort   . Shortness of breath     Past Surgical History:  Procedure Laterality Date  . APPENDECTOMY    . EXPLORATORY LAPAROTOMY    . Hysterectomy    . L temple cut      OB History    No data available       Home Medications    Prior to Admission medications   Medication Sig Start Date End Date Taking? Authorizing Provider  amitriptyline (ELAVIL) 10 MG tablet Take 1 tablet (10 mg total) by mouth at bedtime. 03/17/16   Anson Fret, MD  benzonatate (TESSALON) 100 MG capsule Take 100 mg by mouth daily as needed for cough.     Historical Provider, MD  clonazePAM (KLONOPIN) 2 MG tablet Take 2 mg by mouth at bedtime.     Historical Provider, MD  fluticasone (FLONASE) 50 MCG/ACT nasal spray Place 1 spray into both nostrils daily as needed for allergies or rhinitis.    Historical Provider, MD  furosemide (LASIX) 20 MG tablet Take 1 tablet (20 mg total) by mouth daily as needed. 12/05/16 03/05/17  Antoine Poche, MD  gabapentin (NEURONTIN) 300 MG capsule Take 4 capsules (1,200 mg total) by mouth 3 (three) times daily. Patient taking differently: Take 900 mg by mouth 3 (three) times daily.  03/13/16   Anson Fret, MD  metoprolol succinate (TOPROL-XL) 25 MG 24 hr tablet Take 25 mg  by mouth daily.    Historical Provider, MD  metoprolol tartrate (LOPRESSOR) 25 MG tablet Take 25 mg by mouth daily.    Historical Provider, MD  Misc Natural Products (MAXIMUM MEMORY) TABS Take 1 tablet by mouth every morning. BRAIN STRONG MEMORY SUPPORT    Historical Provider, MD  nitroGLYCERIN (NITROSTAT) 0.3 MG SL tablet Place 1 tablet (0.3 mg total) under the tongue every 5 (five) minutes as needed for chest pain. 02/10/14   Luis Abed, MD  pantoprazole (PROTONIX) 40 MG tablet Take 40 mg by mouth daily.    Historical Provider, MD  promethazine (PHENERGAN) 25 MG tablet Take 12.5 mg by mouth every 6 (six) hours as needed for nausea or vomiting.     Historical Provider, MD  ranitidine (ZANTAC) 150 MG  tablet Take 150 mg by mouth as needed for heartburn.    Historical Provider, MD    Family History Family History  Problem Relation Age of Onset  . Heart failure Mother   . Heart disease Father   . Heart failure Father   . Heart disease Sister   . Heart attack Sister   . Heart disease Brother   . Heart attack Brother   . Migraines Neg Hx     Social History Social History  Substance Use Topics  . Smoking status: Former Smoker    Packs/day: 2.00    Years: 7.00    Types: Cigarettes    Quit date: 09/08/1964  . Smokeless tobacco: Never Used  . Alcohol use No     Allergies   Codeine; Darvocet [propoxyphene n-acetaminophen]; Dilaudid [hydromorphone hcl]; Morphine and related; Boniva [ibandronate sodium]; Depakote er [divalproex sodium er]; Erythromycin; Levaquin [levofloxacin in d5w]; Macrodantin; Ropinirole hcl; Sulfa antibiotics; Tegretol [carbamazepine]; Topamax [topiramate]; and Aspirin   Review of Systems Review of Systems  Constitutional: Positive for activity change and appetite change. Negative for diaphoresis.  Respiratory: Positive for cough.   Gastrointestinal: Negative for abdominal distention, abdominal pain, blood in stool, diarrhea and vomiting.  Genitourinary: Positive for frequency.  Musculoskeletal: Positive for arthralgias.  Neurological: Positive for weakness and headaches.  Psychiatric/Behavioral: Positive for confusion.  All other systems reviewed and are negative.    Physical Exam Updated Vital Signs BP (!) 144/75 (BP Location: Right Arm)   Pulse 80   Temp 98.2 F (36.8 C) (Rectal)   Resp 20   Ht  (1.499 m)   Wt 49.9 kg   SpO2 98%   BMI 22.22 kg/m   Physical Exam  Constitutional: She is oriented to person, place, and time. She appears well-developed and well-nourished.  Non-toxic appearance.  HENT:  Head: Normocephalic.    Right Ear: Tympanic membrane and external ear normal.  Left Ear: Tympanic membrane and external ear normal.    Nose: No epistaxis.  Eyes: EOM and lids are normal. Pupils are equal, round, and reactive to light.  Neck: Normal range of motion. Neck supple. Carotid bruit is not present. No tracheal deviation present.  Cardiovascular: Normal rate, regular rhythm, normal heart sounds, intact distal pulses and normal pulses.  Exam reveals no friction rub.   No murmur heard. Pulmonary/Chest: Effort normal and breath sounds normal. No stridor. No respiratory distress. She has no wheezes. She has no rales.  Abdominal: Soft. Bowel sounds are normal. She exhibits no distension. There is no tenderness. There is no guarding.  Musculoskeletal: She exhibits no edema.       Right wrist: She exhibits decreased range of motion, tenderness and deformity.  Legs: Lymphadenopathy:       Head (right side): No submandibular adenopathy present.       Head (left side): No submandibular adenopathy present.    She has no cervical adenopathy.  Neurological: She is alert and oriented to person, place, and time. She has normal strength. No cranial nerve deficit or sensory deficit.  Skin: Skin is warm and dry.  Psychiatric: She has a normal mood and affect. Her speech is normal.  Nursing note and vitals reviewed.    ED Treatments / Results  Labs (all labs ordered are listed, but only abnormal results are displayed) Labs Reviewed  CULTURE, BLOOD (ROUTINE X 2)  CULTURE, BLOOD (ROUTINE X 2)  COMPREHENSIVE METABOLIC PANEL  AMMONIA  CBC WITH DIFFERENTIAL/PLATELET  URINALYSIS, ROUTINE W REFLEX MICROSCOPIC  LACTIC ACID, PLASMA  LACTIC ACID, PLASMA    EKG  EKG Interpretation None       Radiology No results found.  Procedures Procedures (including critical care time)  Medications Ordered in ED Medications  HYDROcodone-acetaminophen (NORCO/VICODIN) 5-325 MG per tablet 1 tablet (not administered)  ondansetron (ZOFRAN) tablet 4 mg (not administered)  sodium chloride 0.9 % bolus 1,000 mL (not administered)     And  sodium chloride 0.9 % bolus 500 mL (not administered)     Initial Impression / Assessment and Plan / ED Course Pt reviewed with me by Dr Estell Harpin. Code Sepsis called.  I have reviewed the triage vital signs and the nursing notes.  Pertinent labs & imaging results that were available during my care of the patient were reviewed by me and considered in my medical decision making (see chart for details).      Final Clinical Impressions(s) / ED Diagnoses MDM Temp on admission is 100.1.  Pulse ox 92-98 on room air. Rectal temp 98.2 Recheck. No significant change in the examination. Patient continues to be confused at times. Ammonia level is low at 7. Lactic acid was normal at 0.7. Hypertensive metabolic panel is nonacute. Complete blood count shows the white blood cells to be elevated at 11,900, there is no shift to the left. Urinalysis reveals a hazy amber specimen with too many to count white blood cells. Moderate leukocyte esterase and positive nitrates.  X-ray of the right wrist shows mild degenerative changes, and osteopenia, but no fracture.  Recheck. Patient states she is feeling some better, but still having some pain in her wrist and her back.  The CT head scan shows no evidence of traumatic intracranial injury or fracture. There is moderate cortical volume loss and scattered small vessel ischemic changes present. The CT scan of the cervical spine shows no evidence of fracture or subluxation. There are degenerative changes present. There is calcification at the carotid bifurcations.  CT scan of the lumbar spine reveals multiple level endplate compressions involving T11, T12, L4, and L5. There is also compression fractures at L2 and L3. These are not new. Patient sustained a major injury approximately 4 or 5 years ago. There is no evidence of retropulsion appreciated. And there is no canal stenosis identified on CT. Noncontrasted CT scan of the lung suggest a patchy airspace process over  the right lung most prominent over the right upper lung on consistent with infection. There is also some atelectasis of the posterior right upper lobe. There is some cardiomegaly with a three-vessel atherosclerotic coronary artery disease. Case reviewed with Dr Estell Harpin. I discussed these findings with the family in detail and in terms which they understand. I discussed with the  family the need for hospital admission given the level of confusion, the falls, weakness, changes in appetite, complicated by urinary tract infection and right lung pneumonia. The family is in agreement with hospitalization. The case was discussed with [facility] hospitalist. He will see the patient for admission.    Final diagnoses:  Community acquired pneumonia of right upper lobe of lung (HCC)  Urinary tract infection with hematuria, site unspecified    New Prescriptions New Prescriptions   No medications on file     Ivery Quale, Cordelia Poche 12/13/16 2045    Bethann Berkshire, MD 12/13/16 2320

## 2016-12-13 NOTE — ED Triage Notes (Signed)
Per family, pt has been confused for about a week.  Began running a fever yesterday.  Larey Seat 4/1 and injured face.  Fell yesterday and injured right arm.  Family reports legs are not working.  Pt is usually independent and alert and oriented.

## 2016-12-13 NOTE — ED Notes (Signed)
HB apprised of need for antibiotic order

## 2016-12-13 NOTE — H&P (Addendum)
TRH H&P    Patient Demographics:    Crystal Lopez, is a 82 y.o. female  MRN: [id number]  DOB - 1935-05-14  Admit Date - 12/13/2016  Referring MD/NP/PA: Len Childs  Outpatient Primary MD for the patient is Crystal Nadara Mustard, MD  Patient coming from: Home  Chief Complaint  Patient presents with  . Altered Mental Status      HPI:    Crystal Lopez  is a 82 y.o. female, With history of hypertension, GERD who came to hospital after patient had repeated falls since last week. As per family patient had about 3 falls last week she has also been confused. She sustained injury on the face on April 6 patient fell and injured left wrist. Family also noted that patient has been more weaker than usual. Usually she is independent some degree but over the past few days she has been requiring assistance for walking. Patient though  able to provide history is mildly confused, she denies chest pain complains of cough. She also has had shortness of breath. Patient was diagnosed with pneumonia 2 months ago which was treated by PCP, and says that she hasn't felt better since then. She also admits to having dysuria. Denies nausea vomiting or diarrhea.  In the ED imaging studies were done including CT chest, head, cervical spine, lumbar spine.  CT chest showed Patchy airspace process over the right lung most probable prominent over the right upper lobe likely infection. Associated atelectasis posterior right upper lobe.    Review of systems:      A full 10 point Review of Systems was done, except as stated above, all other Review of Systems were negative.   With Past History of the following :    Past Medical History:  Diagnosis Date  . Aspirin allergy   . Chest discomfort    Previous negative ischemic workup  . GERD (gastroesophageal reflux disease)   . Hypertension   . Migraines   . Palpitations   . Restless leg   .  Rib pain    March, 2012, injury from a fall  . Sleep apnea    Suspected diagnosis      Past Surgical History:  Procedure Laterality Date  . APPENDECTOMY    . EXPLORATORY LAPAROTOMY    . Hysterectomy    . L temple cut        Social History:      Social History  Substance Use Topics  . Smoking status: Former Smoker    Packs/day: 2.00    Years: 7.00    Types: Cigarettes    Quit date: 09/08/1964  . Smokeless tobacco: Never Used  . Alcohol use No       Family History :     Family History  Problem Relation Age of Onset  . Heart failure Mother   . Heart disease Father   . Heart failure Father   . Heart disease Sister   . Heart attack Sister   . Heart disease Brother   . Heart attack Brother   .  Migraines Neg Hx       Home Medications:   Prior to Admission medications   Medication Sig Start Date End Date Taking? Authorizing Provider  amitriptyline (ELAVIL) 10 MG tablet Take 1 tablet (10 mg total) by mouth at bedtime. 03/17/16   Anson Fret, MD  benzonatate (TESSALON) 100 MG capsule Take 100 mg by mouth daily as needed for cough.     Historical Provider, MD  clonazePAM (KLONOPIN) 2 MG tablet Take 2 mg by mouth at bedtime.     Historical Provider, MD  fluticasone (FLONASE) 50 MCG/ACT nasal spray Place 1 spray into both nostrils daily as needed for allergies or rhinitis.    Historical Provider, MD  furosemide (LASIX) 20 MG tablet Take 1 tablet (20 mg total) by mouth daily as needed. 12/05/16 03/05/17  Antoine Poche, MD  gabapentin (NEURONTIN) 300 MG capsule Take 4 capsules (1,200 mg total) by mouth 3 (three) times daily. Patient taking differently: Take 900 mg by mouth 3 (three) times daily.  03/13/16   Anson Fret, MD  metoprolol succinate (TOPROL-XL) 25 MG 24 hr tablet Take 25 mg by mouth daily.    Historical Provider, MD  metoprolol tartrate (LOPRESSOR) 25 MG tablet Take 25 mg by mouth daily.    Historical Provider, MD  Misc Natural Products (MAXIMUM MEMORY)  TABS Take 1 tablet by mouth every morning. BRAIN STRONG MEMORY SUPPORT    Historical Provider, MD  nitroGLYCERIN (NITROSTAT) 0.3 MG SL tablet Place 1 tablet (0.3 mg total) under the tongue every 5 (five) minutes as needed for chest pain. 02/10/14   Luis Abed, MD  pantoprazole (PROTONIX) 40 MG tablet Take 40 mg by mouth daily.    Historical Provider, MD  promethazine (PHENERGAN) 25 MG tablet Take 12.5 mg by mouth every 6 (six) hours as needed for nausea or vomiting.     Historical Provider, MD  ranitidine (ZANTAC) 150 MG tablet Take 150 mg by mouth as needed for heartburn.    Historical Provider, MD     Allergies:     Allergies  Allergen Reactions  . Codeine Hives, Shortness Of Breath and Swelling  . Darvocet [Propoxyphene N-Acetaminophen] Hives, Shortness Of Breath and Swelling  . Dilaudid [Hydromorphone Hcl] Hives, Shortness Of Breath and Nausea And Vomiting  . Morphine And Related Hives, Shortness Of Breath and Swelling  . Boniva [Ibandronate Sodium] Nausea And Vomiting  . Depakote Er [Divalproex Sodium Er] Nausea And Vomiting  . Erythromycin Other (See Comments)    UTI  . Levaquin [Levofloxacin In D5w] Nausea And Vomiting  . Macrodantin Nausea And Vomiting  . Ropinirole Hcl Other (See Comments)    insomnia  . Sulfa Antibiotics Hives, Nausea And Vomiting and Swelling  . Tegretol [Carbamazepine] Nausea And Vomiting  . Topamax [Topiramate] Hives and Swelling  . Aspirin     Stomach irritation     Physical Exam:   Vitals  Blood pressure 102/65, pulse 78, temperature 98.3 F (36.8 C), temperature source Oral, resp. rate 18, height  (1.499 m), weight 49.9 kg (110 lb), SpO2 92 %.  1.  General: Appears in no acute distress  2. Psychiatric:  Intact judgement and  insight, awake alert, oriented x 2.  3. Neurologic: No focal neurological deficits, all cranial nerves intact.Strength 5/5 all 4 extremities, sensation intact all 4 extremities, plantars down going.  4. Eyes  :  anicteric sclerae, moist conjunctivae with no lid lag. PERRLA.  5. ENMT:  Oropharynx clear with moist mucous membranes  and good dentition  6. Neck:  supple, no cervical lymphadenopathy appriciated, No thyromegaly  7. Respiratory : Normal respiratory effort, bibasilar crackles left more than right  8. Cardiovascular : RRR, no gallops, rubs or murmurs, no leg edema  9. Gastrointestinal:  Positive bowel sounds, abdomen soft, non-tender to palpation,no hepatosplenomegaly, no rigidity or guarding       10. Skin:  No cyanosis, normal texture and turgor, no rash, lesions or ulcers  11.Musculoskeletal:  Good muscle tone,  joints appear normal , no effusions,  normal range of motion    Data Review:    CBC  Recent Labs Lab 12/13/16 1705  WBC 11.9*  HGB 11.3*  HCT 35.0*  PLT 342  MCV 86.2  MCH 27.8  MCHC 32.3  RDW 13.0  LYMPHSABS 0.7  MONOABS 0.5  EOSABS 0.0  BASOSABS 0.0   ------------------------------------------------------------------------------------------------------------------  Chemistries   Recent Labs Lab 12/13/16 1705  NA 136  K 4.2  CL 98*  CO2 29  GLUCOSE 131*  BUN 9  CREATININE 0.86  CALCIUM 9.5  AST 21  ALT 10*  ALKPHOS 31*  BILITOT 0.6   ------------------------------------------------------------------------------------------------------------------  ------------------------------------------------------------------------------------------------------------------ GFR: Estimated Creatinine Clearance: 36.2 mL/min (by C-G formula based on SCr of 0.86 mg/dL). Liver Function Tests:  Recent Labs Lab 12/13/16 1705  AST 21  ALT 10*  ALKPHOS 31*  BILITOT 0.6  PROT 7.8  ALBUMIN 3.9   No results for input(s): LIPASE, AMYLASE in the last 168 hours.  Recent Labs Lab 12/13/16 1716  AMMONIA 7*    --------------------------------------------------------------------------------------------------------------- Urine analysis:      Component Value Date/Time   COLORURINE AMBER (A) 12/13/2016 1700   APPEARANCEUR HAZY (A) 12/13/2016 1700   LABSPEC 1.013 12/13/2016 1700   PHURINE 7.0 12/13/2016 1700   GLUCOSEU NEGATIVE 12/13/2016 1700   HGBUR SMALL (A) 12/13/2016 1700   BILIRUBINUR NEGATIVE 12/13/2016 1700   KETONESUR NEGATIVE 12/13/2016 1700   PROTEINUR NEGATIVE 12/13/2016 1700   NITRITE POSITIVE (A) 12/13/2016 1700   LEUKOCYTESUR MODERATE (A) 12/13/2016 1700      Imaging Results:    Dg Wrist Complete Right  Result Date: 12/13/2016 CLINICAL DATA:  Confusion, fall yesterday with right arm injury. EXAM: RIGHT WRIST - COMPLETE 3+ VIEW COMPARISON:  None. FINDINGS: Osteopenia limits characterization of osseous detail, however, there is no fracture line or displaced fracture fragment identified. Questionable old fracture of the distal right radius with associated dorsal tilt. No evidence of acute osseous dislocation. Mild degenerative change at the first [facility] joint. No evidence of advanced degenerative osteoarthritis. Adjacent soft tissues are unremarkable. IMPRESSION: Osteopenia.  No acute findings. Electronically Signed   By: Bary Richard M.D.   On: 12/13/2016 17:42   Ct Head Wo Contrast  Result Date: 12/13/2016 CLINICAL DATA:  Acute onset of confusion and fever. Recent fall. Concern for cervical spine injury. Initial encounter. EXAM: CT HEAD WITHOUT CONTRAST CT CERVICAL SPINE WITHOUT CONTRAST TECHNIQUE: Multidetector CT imaging of the head and cervical spine was performed following the standard protocol without intravenous contrast. Multiplanar CT image reconstructions of the cervical spine were also generated. COMPARISON:  None. FINDINGS: CT HEAD FINDINGS Brain: No evidence of acute infarction, hemorrhage, hydrocephalus, extra-axial collection or mass lesion/mass effect. Prominence of the ventricles and sulci reflects moderate cortical volume loss. Mild cerebellar atrophy is noted. Scattered periventricular and subcortical  white matter change likely reflects small vessel ischemic microangiopathy. The brainstem and fourth ventricle are within normal limits. The basal ganglia are unremarkable in appearance. The cerebral hemispheres demonstrate  grossly normal gray-white differentiation. No mass effect or midline shift is seen. Vascular: No hyperdense vessel or unexpected calcification. Skull: There is no evidence of fracture; dense sclerosis is noted at the right mastoid process. Sinuses/Orbits: The orbits are within normal limits. The paranasal sinuses and mastoid air cells are well-aerated. Other: No significant soft tissue abnormalities are seen. CT CERVICAL SPINE FINDINGS Alignment: Normal. Skull base and vertebrae: No acute fracture. No primary bone lesion or focal pathologic process. Soft tissues and spinal canal: No prevertebral fluid or swelling. No visible canal hematoma. Disc levels: Mild intervertebral disc space narrowing is noted along the mid cervical spine, with scattered anterior and posterior disc osteophyte complexes. Underlying facet disease is noted. Upper chest: Interstitial prominence is noted at the lung apices, with mild atelectasis. The thyroid gland is unremarkable in appearance. Calcification is seen at the carotid bifurcations bilaterally. Other: No additional soft tissue abnormalities are seen. IMPRESSION: 1. No evidence of traumatic intracranial injury or fracture. 2. No evidence of fracture or subluxation along the cervical spine. 3. Moderate cortical volume loss and scattered small vessel ischemic microangiopathy. 4. Mild degenerative change along the mid cervical spine. 5. Interstitial prominence at the lung apices, with mild atelectasis. 6. Calcification at the carotid bifurcations bilaterally. Carotid ultrasound would be helpful for further evaluation, when and as deemed clinically appropriate. 7. Dense sclerosis of the right mastoid process. Electronically Signed   By: Roanna Raider M.D.   On:  12/13/2016 19:25   Ct Chest Wo Contrast  Result Date: 12/13/2016 CLINICAL DATA:  Altered mental status for 1 week. Fever beginning yesterday. Fall 12/07/2016. Fall yesterday. EXAM: CT CHEST WITHOUT CONTRAST TECHNIQUE: Multidetector CT imaging of the chest was performed following the standard protocol without IV contrast. COMPARISON:  Chest x-ray 07/24/2016 FINDINGS: Cardiovascular: Mild cardiomegaly. Calcified plaque over the left anterior descending and lateral circumflex as well as right coronary arteries. Calcified plaque over the thoracic aorta. Mediastinum/Nodes: No definite mediastinal or hilar adenopathy. Moderate size hiatal hernia. Fluid over the mid to distal esophagus which may be due to reflux or dysmotility. Lungs/Pleura: Lungs are well inflated and demonstrate a few tiny calcified granulomas bilaterally. There is a 3 mm nodule over the superior segment left lower lobe. 2 mm peripheral nodule over the left upper lobe. There is of patchy faint airspace process over the right lung most notable over the right upper lobe with associated atelectasis over the posterior right upper lobe. Findings likely due to infection. No evidence of effusion Airways are within normal. Upper Abdomen: 2.4 cm hypodensity over the right lobe of the liver likely a cyst or hemangioma. Subcentimeter hypodensity over the left lobe likely a cyst or hemangioma. Multiple surgical clips adjacent the gastroesophageal junction. Calcified plaque over the abdominal aorta. Musculoskeletal: Degenerative change of the spine. Multiple thoracic spinal compression fractures without significant change. IMPRESSION: Patchy airspace process over the right lung most probable prominent over the right upper lobe likely infection. Associated atelectasis posterior right upper lobe. Mild cardiomegaly with 3 vessel atherosclerotic coronary artery disease. Aortic atherosclerosis. Hiatal hernia. Fluid over the mid to distal esophagus likely due to reflux  versus dysmotility. Two liver hypodensities likely cysts or hemangiomas. Multiple thoracic spine compression fractures unchanged. Electronically Signed   By: Elberta Fortis M.D.   On: 12/13/2016 19:36   Ct Cervical Spine Wo Contrast  Result Date: 12/13/2016 CLINICAL DATA:  Acute onset of confusion and fever. Recent fall. Concern for cervical spine injury. Initial encounter. EXAM: CT HEAD WITHOUT CONTRAST CT CERVICAL  SPINE WITHOUT CONTRAST TECHNIQUE: Multidetector CT imaging of the head and cervical spine was performed following the standard protocol without intravenous contrast. Multiplanar CT image reconstructions of the cervical spine were also generated. COMPARISON:  None. FINDINGS: CT HEAD FINDINGS Brain: No evidence of acute infarction, hemorrhage, hydrocephalus, extra-axial collection or mass lesion/mass effect. Prominence of the ventricles and sulci reflects moderate cortical volume loss. Mild cerebellar atrophy is noted. Scattered periventricular and subcortical white matter change likely reflects small vessel ischemic microangiopathy. The brainstem and fourth ventricle are within normal limits. The basal ganglia are unremarkable in appearance. The cerebral hemispheres demonstrate grossly normal gray-white differentiation. No mass effect or midline shift is seen. Vascular: No hyperdense vessel or unexpected calcification. Skull: There is no evidence of fracture; dense sclerosis is noted at the right mastoid process. Sinuses/Orbits: The orbits are within normal limits. The paranasal sinuses and mastoid air cells are well-aerated. Other: No significant soft tissue abnormalities are seen. CT CERVICAL SPINE FINDINGS Alignment: Normal. Skull base and vertebrae: No acute fracture. No primary bone lesion or focal pathologic process. Soft tissues and spinal canal: No prevertebral fluid or swelling. No visible canal hematoma. Disc levels: Mild intervertebral disc space narrowing is noted along the mid cervical  spine, with scattered anterior and posterior disc osteophyte complexes. Underlying facet disease is noted. Upper chest: Interstitial prominence is noted at the lung apices, with mild atelectasis. The thyroid gland is unremarkable in appearance. Calcification is seen at the carotid bifurcations bilaterally. Other: No additional soft tissue abnormalities are seen. IMPRESSION: 1. No evidence of traumatic intracranial injury or fracture. 2. No evidence of fracture or subluxation along the cervical spine. 3. Moderate cortical volume loss and scattered small vessel ischemic microangiopathy. 4. Mild degenerative change along the mid cervical spine. 5. Interstitial prominence at the lung apices, with mild atelectasis. 6. Calcification at the carotid bifurcations bilaterally. Carotid ultrasound would be helpful for further evaluation, when and as deemed clinically appropriate. 7. Dense sclerosis of the right mastoid process. Electronically Signed   By: Roanna Raider M.D.   On: 12/13/2016 19:25   Ct Lumbar Spine Wo Contrast  Result Date: 12/13/2016 CLINICAL DATA:  Patient fell with legs not working properly. EXAM: CT LUMBAR SPINE WITHOUT CONTRAST TECHNIQUE: Multidetector CT imaging of the lumbar spine was performed without intravenous contrast administration. Multiplanar CT image reconstructions were also generated. COMPARISON:  None. FINDINGS: Segmentation: 5 lumbar type vertebrae. Alignment: Normal. Vertebrae: Partially visualized superior endplate compression of T11. There are mild inferior endplate compression of T12, superior endplate compressions of L2 and L3 and inferior compressions of L4 and L5. No retropulsion is noted. No spondylolysis or spondylolisthesis. Paraspinal and other soft tissues: There is aortic atherosclerosis without aneurysm. No retroperitoneal adenopathy. The visualized kidneys demonstrate no obstructive uropathy or nephrolithiasis. Disc levels: No focal disc herniations of the visualized  thoracolumbar spine from T12-L1 through L5-S1. Kissing spinous process ease noted from L1 through L5 with reactive sclerosis consistent with Baastrup's disease. Facet joint space narrowing, sclerosis and hypertrophy are identified most prominent from L3 through S1. IMPRESSION: 1. Multilevel endplate compressions as above described involving what appear to be the superior endplate of a partially visualized T11 vertebral body, mild inferior endplate compressions of T12, L4 and L5 and superior endplate compressions of L2 and L3. No retropulsion noted. No canal stenosis is identified. 2. 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 3. Lower lumbar facet arthropathy L3 through S1 in particular. 4. No central  canal stenosis or significant neural foraminal encroachment. Electronically Signed   By: Tollie Eth M.D.   On: 12/13/2016 19:34    My personal review of EKG: Rhythm NSR,    Assessment & Plan:    Active Problems:   CAP (community acquired pneumonia)   UTI (urinary tract infection)   1. Community-acquired pneumonia- placed under observation, will obtain urinary strep pneumo antigen. Blood cultures 2 have been obtained. Continue ceftriaxone, will start doxycycline 100 mg by mouth twice a day. Follow blood culture results. Will obtain Gram stain and sputum culture 2. UTI-patient complains of dysuria, UA shows positive nitrite. Urine culture has been obtained. Continue ceftriaxone. Follow urine culture results. 3. Hypertension- continue Toprol-XL 25 mg by mouth daily and is also on 25 mg metoprolol. We will hold metoprolol at this time. 4. Status post fall/acute pain- patient complains of pain in the right wrist, chest. CT lumbar spine shows multilevel Endplate compressions, stable thoracic spine fracture. Patient was given 1 dose of Vicodin in the ED without any allergic reaction  Or side effects. Will continue Vicodin 1 tablet every 6  hours when necessary for pain. 5. Lung nodule- CT chest shows 2 and 3 mm nodule in the lung. Patient to follow-up with PCP as outpatient for yearly monitoring.   DVT Prophylaxis-   Lovenox   AM Labs Ordered, also please review Full Orders  Family Communication: Admission, patients condition and plan of care including tests being ordered have been discussed with the patient and Her husband and granddaughter at bedside  who indicate understanding and agree with the plan and Code Status.  Code Status:  Full code  Admission status: Observation    Time spent in minutes : 60 minutes   Doryce Mcgregory S M.D on 12/13/2016 at 9:09 PM  Between 7am to 7pm - Pager - [phone]. After 7pm go to [email or web address] - password [facility]  [facility] - Office  [phone]

## 2016-12-13 NOTE — ED Notes (Signed)
Pt is 4'11" and weighs 115 pounds having seen her cardiologist this week

## 2016-12-13 NOTE — ED Notes (Signed)
Report to Heather, RN.

## 2016-12-14 DIAGNOSIS — Z79899 Other long term (current) drug therapy: Secondary | ICD-10-CM | POA: Diagnosis not present

## 2016-12-14 DIAGNOSIS — R911 Solitary pulmonary nodule: Secondary | ICD-10-CM

## 2016-12-14 DIAGNOSIS — Z888 Allergy status to other drugs, medicaments and biological substances status: Secondary | ICD-10-CM | POA: Diagnosis not present

## 2016-12-14 DIAGNOSIS — J189 Pneumonia, unspecified organism: Secondary | ICD-10-CM | POA: Diagnosis present

## 2016-12-14 DIAGNOSIS — G4730 Sleep apnea, unspecified: Secondary | ICD-10-CM | POA: Diagnosis present

## 2016-12-14 DIAGNOSIS — Z7951 Long term (current) use of inhaled steroids: Secondary | ICD-10-CM | POA: Diagnosis not present

## 2016-12-14 DIAGNOSIS — G9341 Metabolic encephalopathy: Secondary | ICD-10-CM | POA: Diagnosis present

## 2016-12-14 DIAGNOSIS — Z886 Allergy status to analgesic agent status: Secondary | ICD-10-CM | POA: Diagnosis not present

## 2016-12-14 DIAGNOSIS — Z882 Allergy status to sulfonamides status: Secondary | ICD-10-CM | POA: Diagnosis not present

## 2016-12-14 DIAGNOSIS — Z87891 Personal history of nicotine dependence: Secondary | ICD-10-CM | POA: Diagnosis not present

## 2016-12-14 DIAGNOSIS — K219 Gastro-esophageal reflux disease without esophagitis: Secondary | ICD-10-CM | POA: Diagnosis present

## 2016-12-14 DIAGNOSIS — R296 Repeated falls: Secondary | ICD-10-CM | POA: Diagnosis present

## 2016-12-14 DIAGNOSIS — Z9049 Acquired absence of other specified parts of digestive tract: Secondary | ICD-10-CM | POA: Diagnosis not present

## 2016-12-14 DIAGNOSIS — Z8701 Personal history of pneumonia (recurrent): Secondary | ICD-10-CM | POA: Diagnosis not present

## 2016-12-14 DIAGNOSIS — J181 Lobar pneumonia, unspecified organism: Secondary | ICD-10-CM | POA: Diagnosis not present

## 2016-12-14 DIAGNOSIS — W19XXXA Unspecified fall, initial encounter: Secondary | ICD-10-CM | POA: Diagnosis present

## 2016-12-14 DIAGNOSIS — N390 Urinary tract infection, site not specified: Secondary | ICD-10-CM | POA: Diagnosis present

## 2016-12-14 DIAGNOSIS — Z90710 Acquired absence of both cervix and uterus: Secondary | ICD-10-CM | POA: Diagnosis not present

## 2016-12-14 DIAGNOSIS — J9811 Atelectasis: Secondary | ICD-10-CM | POA: Diagnosis present

## 2016-12-14 DIAGNOSIS — Z8249 Family history of ischemic heart disease and other diseases of the circulatory system: Secondary | ICD-10-CM | POA: Diagnosis not present

## 2016-12-14 DIAGNOSIS — I119 Hypertensive heart disease without heart failure: Secondary | ICD-10-CM | POA: Diagnosis present

## 2016-12-14 DIAGNOSIS — Z885 Allergy status to narcotic agent status: Secondary | ICD-10-CM | POA: Diagnosis not present

## 2016-12-14 DIAGNOSIS — M25531 Pain in right wrist: Secondary | ICD-10-CM | POA: Diagnosis present

## 2016-12-14 MED ORDER — ONDANSETRON HCL 4 MG/2ML IJ SOLN
4.0000 mg | Freq: Once | INTRAMUSCULAR | Status: AC
  Administered 2016-12-14: 4 mg via INTRAVENOUS
  Filled 2016-12-14: qty 2

## 2016-12-14 MED ORDER — ACETAMINOPHEN 325 MG PO TABS
650.0000 mg | ORAL_TABLET | Freq: Four times a day (QID) | ORAL | Status: DC | PRN
  Administered 2016-12-14: 650 mg via ORAL
  Filled 2016-12-14: qty 2

## 2016-12-14 NOTE — Progress Notes (Signed)
PROGRESS NOTE    Crystal Lopez  ZOX:[id number] DOB: 01-31-1935 DOA: 12/13/2016 PCP: Bedelia Person, MD     Brief Narrative:  82 y/o woman admitted from home on 4/7 due to confusion and frequent falls. Was found to have CAP and UTI and admission requested.   Assessment & Plan:   Active Problems:   CAP (community acquired pneumonia)   UTI (urinary tract infection)   Acute metabolic encephalopathy   Frequent falls   Lung nodule   CAP -Cx data pending. -Continue rocephin/doxycycline.  UTI -Continue rocephin pending cx data.  Acute Metabolic Encephalopathy -Resolved, due to acute infections.  Frequent Falls -PT evaluation pending. -Likely due to acute infectious processes. -Check TSH/B12.  Long Nodule -Will need follow up imaging.  Right wrist Pain -Following fall. -Xray without fracture. -Has some edema, probably a sprain. -Have requested a wrist splint.   DVT prophylaxis: lovenox Code Status: full code Family Communication: granddaughter at bedside updated on plan of care and all questions answered Disposition Plan: pending medical stability and PT evaluation  Consultants:   None  Procedures:   None  Antimicrobials:  Anti-infectives    Start     Dose/Rate Route Frequency Ordered Stop   12/14/16 1700  cefTRIAXone (ROCEPHIN) 1 g in dextrose 5 % 50 mL IVPB     1 g 100 mL/hr over 30 Minutes Intravenous Every 24 hours 12/13/16 2225 12/21/16 1659   12/13/16 2230  doxycycline (VIBRA-TABS) tablet 100 mg     100 mg Oral Every 12 hours 12/13/16 2225 12/20/16 2159   12/13/16 1800  cefTRIAXone (ROCEPHIN) 1 g in dextrose 5 % 50 mL IVPB     1 g 100 mL/hr over 30 Minutes Intravenous  Once 12/13/16 1800 12/13/16 1837       Subjective: Right wrist is painful, tired and fatigued  Objective: Vitals:   12/13/16 2248 12/14/16 0127 12/14/16 0607 12/14/16 1434  BP: (!) 119/47 (!) 109/37 (!) 139/54 (!) 131/44  Pulse: 72 68 84 74  Resp: Temp: 98.9 F  (37.2 C)  98.9 F (37.2 C) 98.9 F (37.2 C)  TempSrc: Oral  Oral Oral  SpO2: 94% 98% 94% 95%  Weight: 50.2 kg (110 lb 10.7 oz)     Height:  (1.499 m)       Intake/Output Summary (Last 24 hours) at 12/14/16 1653 Last data filed at 12/14/16 1510  Gross per 24 hour  Intake           227.17 ml  Output              213 ml  Net            14.17 ml   Filed Weights   12/13/16 1607 12/13/16 2248  Weight: 49.9 kg (110 lb) 50.2 kg (110 lb 10.7 oz)    Examination:  General exam: Alert, awake, oriented x 3 Respiratory system: Clear to auscultation. Respiratory effort normal. Cardiovascular system:RRR. No murmurs, rubs, gallops. Gastrointestinal system: Abdomen is nondistended, soft and nontender. No organomegaly or masses felt. Normal bowel sounds heard. Central nervous system: Alert and oriented. No focal neurological deficits. Extremities: No C/C/E, +pedal pulses Skin: No rashes, lesions or ulcers Psychiatry: Judgement and insight appear normal. Mood & affect appropriate.     Data Reviewed: I have personally reviewed following labs and imaging studies  CBC:  Recent Labs Lab 12/13/16 1705  WBC 11.9*  NEUTROABS 10.6*  HGB 11.3*  HCT 35.0*  MCV 86.2  PLT 342   Basic Metabolic Panel:  Recent Labs Lab 12/13/16 1705  NA 136  K 4.2  CL 98*  CO2 29  GLUCOSE 131*  BUN 9  CREATININE 0.86  CALCIUM 9.5   GFR: Estimated Creatinine Clearance: 36.2 mL/min (by C-G formula based on SCr of 0.86 mg/dL). Liver Function Tests:  Recent Labs Lab 12/13/16 1705  AST 21  ALT 10*  ALKPHOS 31*  BILITOT 0.6  PROT 7.8  ALBUMIN 3.9   No results for input(s): LIPASE, AMYLASE in the last 168 hours.  Recent Labs Lab 12/13/16 1716  AMMONIA 7*   Coagulation Profile: No results for input(s): INR, PROTIME in the last 168 hours. Cardiac Enzymes: No results for input(s): CKTOTAL, CKMB, CKMBINDEX, TROPONINI in the last 168 hours. BNP (last 3 results) No results for  input(s): PROBNP in the last 8760 hours. HbA1C: No results for input(s): HGBA1C in the last 72 hours. CBG: No results for input(s): GLUCAP in the last 168 hours. Lipid Profile: No results for input(s): CHOL, HDL, LDLCALC, TRIG, CHOLHDL, LDLDIRECT in the last 72 hours. Thyroid Function Tests: No results for input(s): TSH, T4TOTAL, FREET4, T3FREE, THYROIDAB in the last 72 hours. Anemia Panel: No results for input(s): VITAMINB12, FOLATE, FERRITIN, TIBC, IRON, RETICCTPCT in the last 72 hours. Urine analysis:    Component Value Date/Time   COLORURINE AMBER (A) 12/13/2016 1700   APPEARANCEUR HAZY (A) 12/13/2016 1700   LABSPEC 1.013 12/13/2016 1700   PHURINE 7.0 12/13/2016 1700   GLUCOSEU NEGATIVE 12/13/2016 1700   HGBUR SMALL (A) 12/13/2016 1700   BILIRUBINUR NEGATIVE 12/13/2016 1700   KETONESUR NEGATIVE 12/13/2016 1700   PROTEINUR NEGATIVE 12/13/2016 1700   NITRITE POSITIVE (A) 12/13/2016 1700   LEUKOCYTESUR MODERATE (A) 12/13/2016 1700   Sepsis Labs: @LABRCNTIP (procalcitonin:4,lacticidven:4)  ) Recent Results (from the past 240 hour(s))  Blood Culture (routine x 2)     Status: None (Preliminary result)   Collection Time: 12/13/16  5:17 PM  Result Value Ref Range Status   Specimen Description RIGHT ANTECUBITAL  Final   Special Requests   Final    BOTTLES DRAWN AEROBIC AND ANAEROBIC Blood Culture adequate volume   Culture NO GROWTH < 24 HOURS  Final   Report Status PENDING  Incomplete  Blood Culture (routine x 2)     Status: None (Preliminary result)   Collection Time: 12/13/16  5:18 PM  Result Value Ref Range Status   Specimen Description RIGHT ANTECUBITAL  Final   Special Requests Blood Culture adequate volume  Final   Culture NO GROWTH < 24 HOURS  Final   Report Status PENDING  Incomplete  Culture, blood (routine x 2) Call MD if unable to obtain prior to antibiotics being given     Status: None (Preliminary result)   Collection Time: 12/13/16 10:54 PM  Result Value Ref  Range Status   Specimen Description BLOOD  Final   Special Requests NONE  Final   Culture NO GROWTH < 24 HOURS  Final   Report Status PENDING  Incomplete  Culture, blood (routine x 2) Call MD if unable to obtain prior to antibiotics being given     Status: None (Preliminary result)   Collection Time: 12/13/16 11:08 PM  Result Value Ref Range Status   Specimen Description BLOOD  Final   Special Requests NONE  Final   Culture NO GROWTH < 24 HOURS  Final   Report Status PENDING  Incomplete         Radiology Studies: Dg Wrist  Complete Right  Result Date: 12/13/2016 CLINICAL DATA:  Confusion, fall yesterday with right arm injury. EXAM: RIGHT WRIST - COMPLETE 3+ VIEW COMPARISON:  None. FINDINGS: Osteopenia limits characterization of osseous detail, however, there is no fracture line or displaced fracture fragment identified. Questionable old fracture of the distal right radius with associated dorsal tilt. No evidence of acute osseous dislocation. Mild degenerative change at the first [facility] joint. No evidence of advanced degenerative osteoarthritis. Adjacent soft tissues are unremarkable. IMPRESSION: Osteopenia.  No acute findings. Electronically Signed   By: Bary Richard M.D.   On: 12/13/2016 17:42   Ct Head Wo Contrast  Result Date: 12/13/2016 CLINICAL DATA:  Acute onset of confusion and fever. Recent fall. Concern for cervical spine injury. Initial encounter. EXAM: CT HEAD WITHOUT CONTRAST CT CERVICAL SPINE WITHOUT CONTRAST TECHNIQUE: Multidetector CT imaging of the head and cervical spine was performed following the standard protocol without intravenous contrast. Multiplanar CT image reconstructions of the cervical spine were also generated. COMPARISON:  None. FINDINGS: CT HEAD FINDINGS Brain: No evidence of acute infarction, hemorrhage, hydrocephalus, extra-axial collection or mass lesion/mass effect. Prominence of the ventricles and sulci reflects moderate cortical volume loss. Mild cerebellar  atrophy is noted. Scattered periventricular and subcortical white matter change likely reflects small vessel ischemic microangiopathy. The brainstem and fourth ventricle are within normal limits. The basal ganglia are unremarkable in appearance. The cerebral hemispheres demonstrate grossly normal gray-white differentiation. No mass effect or midline shift is seen. Vascular: No hyperdense vessel or unexpected calcification. Skull: There is no evidence of fracture; dense sclerosis is noted at the right mastoid process. Sinuses/Orbits: The orbits are within normal limits. The paranasal sinuses and mastoid air cells are well-aerated. Other: No significant soft tissue abnormalities are seen. CT CERVICAL SPINE FINDINGS Alignment: Normal. Skull base and vertebrae: No acute fracture. No primary bone lesion or focal pathologic process. Soft tissues and spinal canal: No prevertebral fluid or swelling. No visible canal hematoma. Disc levels: Mild intervertebral disc space narrowing is noted along the mid cervical spine, with scattered anterior and posterior disc osteophyte complexes. Underlying facet disease is noted. Upper chest: Interstitial prominence is noted at the lung apices, with mild atelectasis. The thyroid gland is unremarkable in appearance. Calcification is seen at the carotid bifurcations bilaterally. Other: No additional soft tissue abnormalities are seen. IMPRESSION: 1. No evidence of traumatic intracranial injury or fracture. 2. No evidence of fracture or subluxation along the cervical spine. 3. Moderate cortical volume loss and scattered small vessel ischemic microangiopathy. 4. Mild degenerative change along the mid cervical spine. 5. Interstitial prominence at the lung apices, with mild atelectasis. 6. Calcification at the carotid bifurcations bilaterally. Carotid ultrasound would be helpful for further evaluation, when and as deemed clinically appropriate. 7. Dense sclerosis of the right mastoid process.  Electronically Signed   By: Roanna Raider M.D.   On: 12/13/2016 19:25   Ct Chest Wo Contrast  Result Date: 12/13/2016 CLINICAL DATA:  Altered mental status for 1 week. Fever beginning yesterday. Fall 12/07/2016. Fall yesterday. EXAM: CT CHEST WITHOUT CONTRAST TECHNIQUE: Multidetector CT imaging of the chest was performed following the standard protocol without IV contrast. COMPARISON:  Chest x-ray 07/24/2016 FINDINGS: Cardiovascular: Mild cardiomegaly. Calcified plaque over the left anterior descending and lateral circumflex as well as right coronary arteries. Calcified plaque over the thoracic aorta. Mediastinum/Nodes: No definite mediastinal or hilar adenopathy. Moderate size hiatal hernia. Fluid over the mid to distal esophagus which may be due to reflux or dysmotility. Lungs/Pleura: Lungs are well  inflated and demonstrate a few tiny calcified granulomas bilaterally. There is a 3 mm nodule over the superior segment left lower lobe. 2 mm peripheral nodule over the left upper lobe. There is of patchy faint airspace process over the right lung most notable over the right upper lobe with associated atelectasis over the posterior right upper lobe. Findings likely due to infection. No evidence of effusion Airways are within normal. Upper Abdomen: 2.4 cm hypodensity over the right lobe of the liver likely a cyst or hemangioma. Subcentimeter hypodensity over the left lobe likely a cyst or hemangioma. Multiple surgical clips adjacent the gastroesophageal junction. Calcified plaque over the abdominal aorta. Musculoskeletal: Degenerative change of the spine. Multiple thoracic spinal compression fractures without significant change. IMPRESSION: Patchy airspace process over the right lung most probable prominent over the right upper lobe likely infection. Associated atelectasis posterior right upper lobe. Mild cardiomegaly with 3 vessel atherosclerotic coronary artery disease. Aortic atherosclerosis. Hiatal hernia. Fluid  over the mid to distal esophagus likely due to reflux versus dysmotility. Two liver hypodensities likely cysts or hemangiomas. Multiple thoracic spine compression fractures unchanged. Electronically Signed   By: Elberta Fortis M.D.   On: 12/13/2016 19:36   Ct Cervical Spine Wo Contrast  Result Date: 12/13/2016 CLINICAL DATA:  Acute onset of confusion and fever. Recent fall. Concern for cervical spine injury. Initial encounter. EXAM: CT HEAD WITHOUT CONTRAST CT CERVICAL SPINE WITHOUT CONTRAST TECHNIQUE: Multidetector CT imaging of the head and cervical spine was performed following the standard protocol without intravenous contrast. Multiplanar CT image reconstructions of the cervical spine were also generated. COMPARISON:  None. FINDINGS: CT HEAD FINDINGS Brain: No evidence of acute infarction, hemorrhage, hydrocephalus, extra-axial collection or mass lesion/mass effect. Prominence of the ventricles and sulci reflects moderate cortical volume loss. Mild cerebellar atrophy is noted. Scattered periventricular and subcortical white matter change likely reflects small vessel ischemic microangiopathy. The brainstem and fourth ventricle are within normal limits. The basal ganglia are unremarkable in appearance. The cerebral hemispheres demonstrate grossly normal gray-white differentiation. No mass effect or midline shift is seen. Vascular: No hyperdense vessel or unexpected calcification. Skull: There is no evidence of fracture; dense sclerosis is noted at the right mastoid process. Sinuses/Orbits: The orbits are within normal limits. The paranasal sinuses and mastoid air cells are well-aerated. Other: No significant soft tissue abnormalities are seen. CT CERVICAL SPINE FINDINGS Alignment: Normal. Skull base and vertebrae: No acute fracture. No primary bone lesion or focal pathologic process. Soft tissues and spinal canal: No prevertebral fluid or swelling. No visible canal hematoma. Disc levels: Mild intervertebral  disc space narrowing is noted along the mid cervical spine, with scattered anterior and posterior disc osteophyte complexes. Underlying facet disease is noted. Upper chest: Interstitial prominence is noted at the lung apices, with mild atelectasis. The thyroid gland is unremarkable in appearance. Calcification is seen at the carotid bifurcations bilaterally. Other: No additional soft tissue abnormalities are seen. IMPRESSION: 1. No evidence of traumatic intracranial injury or fracture. 2. No evidence of fracture or subluxation along the cervical spine. 3. Moderate cortical volume loss and scattered small vessel ischemic microangiopathy. 4. Mild degenerative change along the mid cervical spine. 5. Interstitial prominence at the lung apices, with mild atelectasis. 6. Calcification at the carotid bifurcations bilaterally. Carotid ultrasound would be helpful for further evaluation, when and as deemed clinically appropriate. 7. Dense sclerosis of the right mastoid process. Electronically Signed   By: Roanna Raider M.D.   On: 12/13/2016 19:25   Ct Lumbar Spine  Wo Contrast  Result Date: 12/13/2016 CLINICAL DATA:  Patient fell with legs not working properly. EXAM: CT LUMBAR SPINE WITHOUT CONTRAST TECHNIQUE: Multidetector CT imaging of the lumbar spine was performed without intravenous contrast administration. Multiplanar CT image reconstructions were also generated. COMPARISON:  None. FINDINGS: Segmentation: 5 lumbar type vertebrae. Alignment: Normal. Vertebrae: Partially visualized superior endplate compression of T11. There are mild inferior endplate compression of T12, superior endplate compressions of L2 and L3 and inferior compressions of L4 and L5. No retropulsion is noted. No spondylolysis or spondylolisthesis. Paraspinal and other soft tissues: There is aortic atherosclerosis without aneurysm. No retroperitoneal adenopathy. The visualized kidneys demonstrate no obstructive uropathy or nephrolithiasis. Disc  levels: No focal disc herniations of the visualized thoracolumbar spine from T12-L1 through L5-S1. Kissing spinous process ease noted from L1 through L5 with reactive sclerosis consistent with Baastrup's disease. Facet joint space narrowing, sclerosis and hypertrophy are identified most prominent from L3 through S1. IMPRESSION: 1. Multilevel endplate compressions as above described involving what appear to be the superior endplate of a partially visualized T11 vertebral body, mild inferior endplate compressions of T12, L4 and L5 and superior endplate compressions of L2 and L3. No retropulsion noted. No canal stenosis is identified. 2. 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 3. Lower lumbar facet arthropathy L3 through S1 in particular. 4. No central canal stenosis or significant neural foraminal encroachment. Electronically Signed   By: Tollie Eth M.D.   On: 12/13/2016 19:34        Scheduled Meds: . amitriptyline  10 mg Oral QHS  . cefTRIAXone (ROCEPHIN)  IV  1 g Intravenous Q24H  . clonazePAM  2 mg Oral QHS  . doxycycline  100 mg Oral Q12H  . enoxaparin (LOVENOX) injection  40 mg Subcutaneous Q24H  . famotidine  20 mg Oral Daily  . gabapentin  900 mg Oral TID  . metoprolol succinate  25 mg Oral Daily  . pantoprazole  40 mg Oral Daily   Continuous Infusions: . sodium chloride 10 mL/hr at 12/13/16 2317     LOS: 0 days    Time spent: 25 minutes. Greater than 50% of this time was spent in direct contact with the patient coordinating care.     Chaya Jan, MD [facility] Pager [phone]  If 7PM-7AM, please contact night-coverage [email or web address] Password [email or web address] 12/14/2016, 4:53 PM

## 2016-12-14 NOTE — Progress Notes (Signed)
Patient has not voided during night.  Patient voided approximately 50 ml with a bladder scan showing approximately 327 ml. Dr. Ardyth Harps notified via text page.

## 2016-12-14 NOTE — Progress Notes (Signed)
Patient reports pain and nausea.  Pain and nausea medicine not due again until approximately 1600.  Dr. Ardyth Harps notified via text page.  Dr. Ardyth Harps returned page and gave order for one time dose of Zofran 4 mg IV and Tylenol order prn for pain.

## 2016-12-15 DIAGNOSIS — R911 Solitary pulmonary nodule: Secondary | ICD-10-CM

## 2016-12-15 LAB — BASIC METABOLIC PANEL
Anion gap: 8 (ref 5–15)
BUN: 6 mg/dL (ref 6–20)
CO2: 26 mmol/L (ref 22–32)
Calcium: 8.4 mg/dL — ABNORMAL LOW (ref 8.9–10.3)
Chloride: 102 mmol/L (ref 101–111)
Creatinine, Ser: 0.73 mg/dL (ref 0.44–1.00)
GFR calc Af Amer: >60 mL/min (ref >60)
GLUCOSE: 85 mg/dL (ref 65–99)
POTASSIUM: 3.5 mmol/L (ref 3.5–5.1)
Sodium: 136 mmol/L (ref 135–145)

## 2016-12-15 LAB — CBC
HEMATOCRIT: 27.3 % — AB (ref 36.0–46.0)
Hemoglobin: 8.7 g/dL — ABNORMAL LOW (ref 12.0–15.0)
MCH: 27.8 pg (ref 26.0–34.0)
MCHC: 32.2 g/dL (ref 30.0–36.0)
MCV: 86.1 fL (ref 78.0–100.0)
Platelets: 301 10*3/uL (ref 150–400)
RBC: 3.17 MIL/uL — ABNORMAL LOW (ref 3.87–5.11)
RDW: 12.9 % (ref 11.5–15.5)
WBC: 5.9 10*3/uL (ref 4.0–10.5)

## 2016-12-15 LAB — STREP PNEUMONIAE URINARY ANTIGEN: STREP PNEUMO URINARY ANTIGEN: NEGATIVE

## 2016-12-15 LAB — HIV ANTIBODY (ROUTINE TESTING W REFLEX): HIV Screen 4th Generation wRfx: NONREACTIVE

## 2016-12-15 LAB — TSH: TSH: 3.321 u[IU]/mL (ref 0.350–4.500)

## 2016-12-15 MED ORDER — GABAPENTIN 300 MG PO CAPS
300.0000 mg | ORAL_CAPSULE | Freq: Three times a day (TID) | ORAL | Status: DC
  Administered 2016-12-15 – 2016-12-16 (×4): 300 mg via ORAL
  Filled 2016-12-15 (×3): qty 1

## 2016-12-15 NOTE — Care Management Note (Signed)
Case Management Note  Patient Details  Name: Crystal Lopez MRN: [id number] Date of Birth: 1934/11/17  Subjective/Objective:                  Pt admitted with CAP and UTI. She is from home, lives with her husband. She has PCP, transportation to appointments and no difficulty affording or managing medications. Family at bedside to discuss DC planning. Pt ind with ADL's at baseline, has all necessary DME. PT has recommended HH PT. Pt is agreeable and has used [facility] in the past, would like them again.   Action/Plan: Pt plans to return home with [facility] services. Brent Foley, [facility] rep, aware of referral and has been faxed pt info. HH orders and DC summary will be faxed once available. CM will cont to follow.   Expected Discharge Date:     12/16/2016             Expected Discharge Plan:  Home w Home Health Services  In-House Referral:  NA  Discharge planning Services  CM Consult  Post Acute Care Choice:  Home Health Choice offered to:  Patient  HH Arranged:  RN, PT [facility] Agency:  Other - See comment ([facility])  Status of Service:  In process, will continue to follow  Malcolm Metro, RN 12/15/2016, 1:39 PM

## 2016-12-15 NOTE — Progress Notes (Signed)
PROGRESS NOTE    Crystal Lopez  ZOX:[id number] DOB: Mar 16, 1935 DOA: 12/13/2016 PCP: Bedelia Person, MD     Brief Narrative:  82 y/o woman admitted from home on 4/7 due to confusion and frequent falls. Was found to have CAP and UTI and admission requested.   Assessment & Plan:   Active Problems:   CAP (community acquired pneumonia)   UTI (urinary tract infection)   Acute metabolic encephalopathy   Frequent falls   Lung nodule   CAP -Cx data pending. -Continue rocephin/doxycycline.  UTI -Continue rocephin pending cx data.  Acute Metabolic Encephalopathy -Resolved, due to acute infections.  Frequent Falls -PT: HHPT -Likely due to acute infectious processes. -Check TSH/B12.  Long Nodule -Will need follow up imaging.  Right wrist Pain -Following fall. -Xray without fracture. -Has some edema, probably a sprain. -Have requested a wrist splint. Pain improved, swelling improved.   DVT prophylaxis: lovenox Code Status: full code Family Communication: granddaughter at bedside updated on plan of care and all questions answered Disposition Plan: home for DC home in 24-48 hours  Consultants:   None  Procedures:   None  Antimicrobials:  Anti-infectives    Start     Dose/Rate Route Frequency Ordered Stop   12/14/16 1700  cefTRIAXone (ROCEPHIN) 1 g in dextrose 5 % 50 mL IVPB     1 g 100 mL/hr over 30 Minutes Intravenous Every 24 hours 12/13/16 2225 12/21/16 1659   12/13/16 2230  doxycycline (VIBRA-TABS) tablet 100 mg     100 mg Oral Every 12 hours 12/13/16 2225 12/20/16 2159   12/13/16 1800  cefTRIAXone (ROCEPHIN) 1 g in dextrose 5 % 50 mL IVPB     1 g 100 mL/hr over 30 Minutes Intravenous  Once 12/13/16 1800 12/13/16 1837       Subjective: Right wrist is painful, tired and fatigued  Objective: Vitals:   12/14/16 2100 12/15/16 0611 12/15/16 1001 12/15/16 1448  BP: (!) 134/48 (!) 129/47 (!) 124/54 (!) 146/41  Pulse: 66 73 77 68  Resp: Temp:  98.7 F (37.1 C) 97.6 F (36.4 C)  98.6 F (37 C)  TempSrc: Oral Oral  Oral  SpO2: 95% 93%  100%  Weight:      Height:        Intake/Output Summary (Last 24 hours) at 12/15/16 1814 Last data filed at 12/15/16 1700  Gross per 24 hour  Intake              940 ml  Output              300 ml  Net              640 ml   Filed Weights   12/13/16 1607 12/13/16 2248  Weight: 49.9 kg (110 lb) 50.2 kg (110 lb 10.7 oz)    Examination:  General exam: Alert, awake, oriented x 3 Respiratory system: Clear to auscultation. Respiratory effort normal. Cardiovascular system:RRR. No murmurs, rubs, gallops. Gastrointestinal system: Abdomen is nondistended, soft and nontender. No organomegaly or masses felt. Normal bowel sounds heard. Central nervous system: Alert and oriented. No focal neurological deficits. Extremities: No C/C/E, +pedal pulses Skin: No rashes, lesions or ulcers Psychiatry: Judgement and insight appear normal. Mood & affect appropriate.     Data Reviewed: I have personally reviewed following labs and imaging studies  CBC:  Recent Labs Lab 12/13/16 1705 12/15/16 0422  WBC 11.9* 5.9  NEUTROABS 10.6*  --   HGB 11.3*  8.7*  HCT 35.0* 27.3*  MCV 86.2 86.1  PLT 342 301   Basic Metabolic Panel:  Recent Labs Lab 12/13/16 1705 12/15/16 0422  NA 136 136  K 4.2 3.5  CL 98* 102  CO2 29 26  GLUCOSE 131* 85  BUN 9 6  CREATININE 0.86 0.73  CALCIUM 9.5 8.4*   GFR: Estimated Creatinine Clearance: 38.9 mL/min (by C-G formula based on SCr of 0.73 mg/dL). Liver Function Tests:  Recent Labs Lab 12/13/16 1705  AST 21  ALT 10*  ALKPHOS 31*  BILITOT 0.6  PROT 7.8  ALBUMIN 3.9   No results for input(s): LIPASE, AMYLASE in the last 168 hours.  Recent Labs Lab 12/13/16 1716  AMMONIA 7*   Coagulation Profile: No results for input(s): INR, PROTIME in the last 168 hours. Cardiac Enzymes: No results for input(s): CKTOTAL, CKMB, CKMBINDEX, TROPONINI in the last 168  hours. BNP (last 3 results) No results for input(s): PROBNP in the last 8760 hours. HbA1C: No results for input(s): HGBA1C in the last 72 hours. CBG: No results for input(s): GLUCAP in the last 168 hours. Lipid Profile: No results for input(s): CHOL, HDL, LDLCALC, TRIG, CHOLHDL, LDLDIRECT in the last 72 hours. Thyroid Function Tests: No results for input(s): TSH, T4TOTAL, FREET4, T3FREE, THYROIDAB in the last 72 hours. Anemia Panel: No results for input(s): VITAMINB12, FOLATE, FERRITIN, TIBC, IRON, RETICCTPCT in the last 72 hours. Urine analysis:    Component Value Date/Time   COLORURINE AMBER (A) 12/13/2016 1700   APPEARANCEUR HAZY (A) 12/13/2016 1700   LABSPEC 1.013 12/13/2016 1700   PHURINE 7.0 12/13/2016 1700   GLUCOSEU NEGATIVE 12/13/2016 1700   HGBUR SMALL (A) 12/13/2016 1700   BILIRUBINUR NEGATIVE 12/13/2016 1700   KETONESUR NEGATIVE 12/13/2016 1700   PROTEINUR NEGATIVE 12/13/2016 1700   NITRITE POSITIVE (A) 12/13/2016 1700   LEUKOCYTESUR MODERATE (A) 12/13/2016 1700   Sepsis Labs: (procalcitonin:4,lacticidven:4)  ) Recent Results (from the past 240 hour(s))  Blood Culture (routine x 2)     Status: None (Preliminary result)   Collection Time: 12/13/16  5:17 PM  Result Value Ref Range Status   Specimen Description RIGHT ANTECUBITAL  Final   Special Requests   Final    BOTTLES DRAWN AEROBIC AND ANAEROBIC Blood Culture adequate volume   Culture NO GROWTH 2 DAYS  Final   Report Status PENDING  Incomplete  Blood Culture (routine x 2)     Status: None (Preliminary result)   Collection Time: 12/13/16  5:18 PM  Result Value Ref Range Status   Specimen Description RIGHT ANTECUBITAL  Final   Special Requests Blood Culture adequate volume  Final   Culture NO GROWTH 2 DAYS  Final   Report Status PENDING  Incomplete  Culture, blood (routine x 2) Call MD if unable to obtain prior to antibiotics being given     Status: None (Preliminary result)   Collection Time:  12/13/16 10:54 PM  Result Value Ref Range Status   Specimen Description LEFT ANTECUBITAL  Final   Special Requests   Final    BOTTLES DRAWN AEROBIC AND ANAEROBIC Blood Culture adequate volume   Culture NO GROWTH 2 DAYS  Final   Report Status PENDING  Incomplete  Culture, blood (routine x 2) Call MD if unable to obtain prior to antibiotics being given     Status: None (Preliminary result)   Collection Time: 12/13/16 11:08 PM  Result Value Ref Range Status   Specimen Description RIGHT ANTECUBITAL  Final   Special Requests  Final    BOTTLES DRAWN AEROBIC AND ANAEROBIC Blood Culture adequate volume   Culture NO GROWTH 2 DAYS  Final   Report Status PENDING  Incomplete         Radiology Studies: Ct Head Wo Contrast  Result Date: 12/13/2016 CLINICAL DATA:  Acute onset of confusion and fever. Recent fall. Concern for cervical spine injury. Initial encounter. EXAM: CT HEAD WITHOUT CONTRAST CT CERVICAL SPINE WITHOUT CONTRAST TECHNIQUE: Multidetector CT imaging of the head and cervical spine was performed following the standard protocol without intravenous contrast. Multiplanar CT image reconstructions of the cervical spine were also generated. COMPARISON:  None. FINDINGS: CT HEAD FINDINGS Brain: No evidence of acute infarction, hemorrhage, hydrocephalus, extra-axial collection or mass lesion/mass effect. Prominence of the ventricles and sulci reflects moderate cortical volume loss. Mild cerebellar atrophy is noted. Scattered periventricular and subcortical white matter change likely reflects small vessel ischemic microangiopathy. The brainstem and fourth ventricle are within normal limits. The basal ganglia are unremarkable in appearance. The cerebral hemispheres demonstrate grossly normal gray-white differentiation. No mass effect or midline shift is seen. Vascular: No hyperdense vessel or unexpected calcification. Skull: There is no evidence of fracture; dense sclerosis is noted at the right mastoid  process. Sinuses/Orbits: The orbits are within normal limits. The paranasal sinuses and mastoid air cells are well-aerated. Other: No significant soft tissue abnormalities are seen. CT CERVICAL SPINE FINDINGS Alignment: Normal. Skull base and vertebrae: No acute fracture. No primary bone lesion or focal pathologic process. Soft tissues and spinal canal: No prevertebral fluid or swelling. No visible canal hematoma. Disc levels: Mild intervertebral disc space narrowing is noted along the mid cervical spine, with scattered anterior and posterior disc osteophyte complexes. Underlying facet disease is noted. Upper chest: Interstitial prominence is noted at the lung apices, with mild atelectasis. The thyroid gland is unremarkable in appearance. Calcification is seen at the carotid bifurcations bilaterally. Other: No additional soft tissue abnormalities are seen. IMPRESSION: 1. No evidence of traumatic intracranial injury or fracture. 2. No evidence of fracture or subluxation along the cervical spine. 3. Moderate cortical volume loss and scattered small vessel ischemic microangiopathy. 4. Mild degenerative change along the mid cervical spine. 5. Interstitial prominence at the lung apices, with mild atelectasis. 6. Calcification at the carotid bifurcations bilaterally. Carotid ultrasound would be helpful for further evaluation, when and as deemed clinically appropriate. 7. Dense sclerosis of the right mastoid process. Electronically Signed   By: Roanna Raider M.D.   On: 12/13/2016 19:25   Ct Chest Wo Contrast  Result Date: 12/13/2016 CLINICAL DATA:  Altered mental status for 1 week. Fever beginning yesterday. Fall 12/07/2016. Fall yesterday. EXAM: CT CHEST WITHOUT CONTRAST TECHNIQUE: Multidetector CT imaging of the chest was performed following the standard protocol without IV contrast. COMPARISON:  Chest x-ray 07/24/2016 FINDINGS: Cardiovascular: Mild cardiomegaly. Calcified plaque over the left anterior descending and  lateral circumflex as well as right coronary arteries. Calcified plaque over the thoracic aorta. Mediastinum/Nodes: No definite mediastinal or hilar adenopathy. Moderate size hiatal hernia. Fluid over the mid to distal esophagus which may be due to reflux or dysmotility. Lungs/Pleura: Lungs are well inflated and demonstrate a few tiny calcified granulomas bilaterally. There is a 3 mm nodule over the superior segment left lower lobe. 2 mm peripheral nodule over the left upper lobe. There is of patchy faint airspace process over the right lung most notable over the right upper lobe with associated atelectasis over the posterior right upper lobe. Findings likely due to infection. No  evidence of effusion Airways are within normal. Upper Abdomen: 2.4 cm hypodensity over the right lobe of the liver likely a cyst or hemangioma. Subcentimeter hypodensity over the left lobe likely a cyst or hemangioma. Multiple surgical clips adjacent the gastroesophageal junction. Calcified plaque over the abdominal aorta. Musculoskeletal: Degenerative change of the spine. Multiple thoracic spinal compression fractures without significant change. IMPRESSION: Patchy airspace process over the right lung most probable prominent over the right upper lobe likely infection. Associated atelectasis posterior right upper lobe. Mild cardiomegaly with 3 vessel atherosclerotic coronary artery disease. Aortic atherosclerosis. Hiatal hernia. Fluid over the mid to distal esophagus likely due to reflux versus dysmotility. Two liver hypodensities likely cysts or hemangiomas. Multiple thoracic spine compression fractures unchanged. Electronically Signed   By: Elberta Fortis M.D.   On: 12/13/2016 19:36   Ct Cervical Spine Wo Contrast  Result Date: 12/13/2016 CLINICAL DATA:  Acute onset of confusion and fever. Recent fall. Concern for cervical spine injury. Initial encounter. EXAM: CT HEAD WITHOUT CONTRAST CT CERVICAL SPINE WITHOUT CONTRAST TECHNIQUE:  Multidetector CT imaging of the head and cervical spine was performed following the standard protocol without intravenous contrast. Multiplanar CT image reconstructions of the cervical spine were also generated. COMPARISON:  None. FINDINGS: CT HEAD FINDINGS Brain: No evidence of acute infarction, hemorrhage, hydrocephalus, extra-axial collection or mass lesion/mass effect. Prominence of the ventricles and sulci reflects moderate cortical volume loss. Mild cerebellar atrophy is noted. Scattered periventricular and subcortical white matter change likely reflects small vessel ischemic microangiopathy. The brainstem and fourth ventricle are within normal limits. The basal ganglia are unremarkable in appearance. The cerebral hemispheres demonstrate grossly normal gray-white differentiation. No mass effect or midline shift is seen. Vascular: No hyperdense vessel or unexpected calcification. Skull: There is no evidence of fracture; dense sclerosis is noted at the right mastoid process. Sinuses/Orbits: The orbits are within normal limits. The paranasal sinuses and mastoid air cells are well-aerated. Other: No significant soft tissue abnormalities are seen. CT CERVICAL SPINE FINDINGS Alignment: Normal. Skull base and vertebrae: No acute fracture. No primary bone lesion or focal pathologic process. Soft tissues and spinal canal: No prevertebral fluid or swelling. No visible canal hematoma. Disc levels: Mild intervertebral disc space narrowing is noted along the mid cervical spine, with scattered anterior and posterior disc osteophyte complexes. Underlying facet disease is noted. Upper chest: Interstitial prominence is noted at the lung apices, with mild atelectasis. The thyroid gland is unremarkable in appearance. Calcification is seen at the carotid bifurcations bilaterally. Other: No additional soft tissue abnormalities are seen. IMPRESSION: 1. No evidence of traumatic intracranial injury or fracture. 2. No evidence of  fracture or subluxation along the cervical spine. 3. Moderate cortical volume loss and scattered small vessel ischemic microangiopathy. 4. Mild degenerative change along the mid cervical spine. 5. Interstitial prominence at the lung apices, with mild atelectasis. 6. Calcification at the carotid bifurcations bilaterally. Carotid ultrasound would be helpful for further evaluation, when and as deemed clinically appropriate. 7. Dense sclerosis of the right mastoid process. Electronically Signed   By: Roanna Raider M.D.   On: 12/13/2016 19:25   Ct Lumbar Spine Wo Contrast  Result Date: 12/13/2016 CLINICAL DATA:  Patient fell with legs not working properly. EXAM: CT LUMBAR SPINE WITHOUT CONTRAST TECHNIQUE: Multidetector CT imaging of the lumbar spine was performed without intravenous contrast administration. Multiplanar CT image reconstructions were also generated. COMPARISON:  None. FINDINGS: Segmentation: 5 lumbar type vertebrae. Alignment: Normal. Vertebrae: Partially visualized superior endplate compression of T11. There are  mild inferior endplate compression of T12, superior endplate compressions of L2 and L3 and inferior compressions of L4 and L5. No retropulsion is noted. No spondylolysis or spondylolisthesis. Paraspinal and other soft tissues: There is aortic atherosclerosis without aneurysm. No retroperitoneal adenopathy. The visualized kidneys demonstrate no obstructive uropathy or nephrolithiasis. Disc levels: No focal disc herniations of the visualized thoracolumbar spine from T12-L1 through L5-S1. Kissing spinous process ease noted from L1 through L5 with reactive sclerosis consistent with Baastrup's disease. Facet joint space narrowing, sclerosis and hypertrophy are identified most prominent from L3 through S1. IMPRESSION: 1. Multilevel endplate compressions as above described involving what appear to be the superior endplate of a partially visualized T11 vertebral body, mild inferior endplate  compressions of T12, L4 and L5 and superior endplate compressions of L2 and L3. No retropulsion noted. No canal stenosis is identified. 2. 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 3. Lower lumbar facet arthropathy L3 through S1 in particular. 4. No central canal stenosis or significant neural foraminal encroachment. Electronically Signed   By: Tollie Eth M.D.   On: 12/13/2016 19:34        Scheduled Meds: . amitriptyline  10 mg Oral QHS  . cefTRIAXone (ROCEPHIN)  IV  1 g Intravenous Q24H  . clonazePAM  2 mg Oral QHS  . doxycycline  100 mg Oral Q12H  . enoxaparin (LOVENOX) injection  40 mg Subcutaneous Q24H  . famotidine  20 mg Oral Daily  . gabapentin  300 mg Oral TID  . metoprolol succinate  25 mg Oral Daily  . pantoprazole  40 mg Oral Daily   Continuous Infusions: . sodium chloride 10 mL/hr at 12/13/16 2317     LOS: 1 day    Time spent: 25 minutes. Greater than 50% of this time was spent in direct contact with the patient coordinating care.     Chaya Jan, MD [facility] Pager [phone]  If 7PM-7AM, please contact night-coverage [email or web address] Password [facility] 12/15/2016, 6:14 PM

## 2016-12-15 NOTE — Care Management (Signed)
Patient Information   Patient Name Crystal Lopez, Crystal Lopez ([id number]) Sex Female DOB Apr 09, 1935  Room Bed  A339 A339-01  Patient Demographics   Address [address] [place] [address] Phone [phone] (Home)  Patient Ethnicity & Race   Ethnic Group Patient Race  Not Hispanic or Latino Other or two or more races  Emergency Contact(s)   Name Relation Home Work Mobile  Lucas Spouse [phone]  [phone]  Donovan Kail [phone]    Documents on File    Status Date Received Description  Documents for the Patient  EMR Patient Summary Not Received    [place] HIPAA NOTICE OF PRIVACY - Scanned Not Received    [place] E-Signature HIPAA Notice of Privacy Received 02/27/11   [place] E-Signature HIPAA Notice of Privacy Spanish Not Received    Driver's License Not Received    Advance Directives/Living Will/HCPOA/POA Not Received    Financial Application Not Received    Insurance Card Received 11/12/15 MEDICARE & [employer] [place] [facility]  Insurance Card Received 01/14/12 Old Card   AMB Intake Forms/Questionnaires Not Received    AMB Correspondence  09/13/14 11/15-12/15 Samuella Cota MD, C  Other Photo ID Not Received    Advanced Beneficiary Notice (ABN) Not Received    E-Signature AOB Spanish Not Received    Insurance Card Received 03/13/16 gna/medicare/[place]  Driver's License Received 03/13/16 gna  [place] E-Signature HIPAA Notice of Privacy Received 03/13/16 gna  AMB Correspondence  06/19/16 CONSENT FORM [facility]  Advance Directives/Living Will/HCPOA/POA  07/25/16   AMB Outside Hospital Record  10/09/16 DISCHARGE INSTRUCTIONS [facility]  AMB Outside Hospital Record  10/09/16 HOME MEDICATION LIST [facility]  AMB Outside Hospital Record  09/03/16 DISCHARGE SUMMARY [facility] Card Received (Deleted) 02/27/11 MEDICARE & [employer]   Insurance Card Received (Deleted) 02/27/11   Documents for the Encounter  AOB (Assignment of Insurance Benefits) Not Received    E-signature AOB Signed 12/13/16   MEDICARE RIGHTS Not Received    E-signature Medicare Rights Signed 12/13/16   Cardiac Monitoring Strip  12/13/16   ED Patient Billing Extract   ED PB Summary  ED Patient Billing Extract   ED Encounter Summary  Cardiac Monitoring Strip Shift Summary  12/14/16   EKG  12/15/16   Admission Information   Attending Provider Admitting Provider Admission Type Admission Date/Time  Estela Isaiah Blakes, MD Meredeth Ide, MD Emergency 12/13/16 1611  Discharge Date Hospital Service Auth/Cert Status Service Area   Internal Medicine Incomplete [place] SERVICE AREA  Unit Room/Bed Admission Status   AP-DEPT 300 A339/A339-01 Admission (Confirmed)   Admission   Complaint  possible stroke  Hospital Account   Name Acct ID Class Status Primary Coverage  Crystal Lopez, Crystal Lopez [id number] Inpatient Open MEDICARE - MEDICARE PART A AND B      Guarantor Account (for Hospital Account [id number])   Name Relation to Pt Service Area Active? Acct Type  Myrtha Mantis Self [facility] Yes Personal/Family  Address Phone    [address] [place], [place] [address] [phone](H)        Coverage Information (for Hospital Account [id number])   1. MEDICARE/MEDICARE PART A AND B   F/O Payor/Plan Precert #  MEDICARE/MEDICARE PART A AND B   Subscriber Subscriber #  [place], [place] [id number]  Address Phone  [address] [place], [place] [address]   2. [employer] [place] [place] SUP   F/O Payor/Plan Precert #  [facility] OF [facility] SUP   Subscriber Subscriber #  Brailyn, [place] [id number]  Address Phone  [address] [facility]

## 2016-12-15 NOTE — Evaluation (Signed)
Physical Therapy Evaluation Patient Details Name: Crystal Lopez MRN: [id number] DOB: September 03, 1935 Today's Date: 12/15/2016   History of Present Illness  82 y.o. female, With history of hypertension, GERD who came to hospital after patient had repeated falls since last week. As per family patient had about 3 falls last week she has also been confused. She sustained injury on the face on April 6 patient fell and injured left wrist. Family also noted that patient has been more weaker than usual. Usually she is independent some degree but over the past few days she has been requiring assistance for walking.    Clinical Impression  Pt received in bed, dtr and granddaugher present, and pt is agreeable to PT evaluation.  Pt states that she normally does not use anything for ambulation, however since she fell 1 week ago, she has required HHA from her family while ambulating.  She is normally independent with dressing and bathing.  During PT evaluation, she is able to ambulate 260ft with RW and supervision/Min guard.  Recommend d/c home with HHPT and continued 24/7 supervision/assistance.      Follow Up Recommendations Home health PT;Supervision/Assistance - 24 hour    Equipment Recommendations  None recommended by PT    Recommendations for Other Services       Precautions / Restrictions Precautions Precautions: Fall Precaution Comments: 4 - fell out of the bed, slipped in the living room.   Restrictions Weight Bearing Restrictions: No      Mobility  Bed Mobility Overal bed mobility: Modified Independent             General bed mobility comments: HOB slightly raised   Transfers Overall transfer level: Needs assistance Equipment used: Rolling walker (2 wheeled) Transfers: Sit to/from Stand Sit to Stand: Supervision            Ambulation/Gait Ambulation/Gait assistance: Supervision;Min guard Ambulation Distance (Feet): 200 Feet Assistive device: Rolling walker (2 wheeled) Gait  Pattern/deviations: Drifts right/left;Step-through pattern   Gait velocity interpretation: <1.8 ft/sec, indicative of risk for recurrent falls General Gait Details: Pt requries directional cues due to some confusion.    Stairs            Wheelchair Mobility    Modified Rankin (Stroke Patients Only)       Balance Overall balance assessment: History of Falls;Needs assistance Sitting-balance support: Bilateral upper extremity supported;Feet supported Sitting balance-Leahy Scale: Good     Standing balance support: Bilateral upper extremity supported Standing balance-Leahy Scale: Fair                               Pertinent Vitals/Pain Pain Assessment: 0-10 Pain Score: 8  Pain Location: neck  Pain Descriptors / Indicators: Aching Pain Intervention(s): Limited activity within patient's tolerance;Monitored during session;Premedicated before session;Repositioned    Home Living   Living Arrangements: Spouse/significant other Available Help at Discharge: Available 24 hours/day Type of Home: House Home Access: Stairs to enter   [employer] of Steps: sidewalk with 1+1 step Home Layout: Able to live on main level with bedroom/bathroom;Laundry or work area in [employer] Equipment: Environmental consultant - 2 wheels;Cane - single point;Bedside commode;Wheelchair - manual;Grab bars - tub/shower;Other (comment) (bed rails)      Prior Function     Gait / Transfers Assistance Needed: Pt normally is independent with ambulation until she fell on Easter.  Since then she has been holding onto her family members.    ADL's / [employer]  Needed: independent with both dressing and bathing.          Hand Dominance   Dominant Hand: Right    Extremity/Trunk Assessment   Upper Extremity Assessment Upper Extremity Assessment: Overall WFL for tasks assessed    Lower Extremity Assessment Lower Extremity Assessment: Overall WFL for tasks assessed        Communication   Communication: No difficulties  Cognition Arousal/Alertness: Awake/alert Behavior During Therapy: WFL for tasks assessed/performed Overall Cognitive Status: Within Functional Limits for tasks assessed                                        General Comments      Exercises     Assessment/Plan    PT Assessment    PT Problem List         PT Treatment Interventions      PT Goals (Current goals can be found in the Care Plan section)  Acute Rehab PT Goals Patient Stated Goal: To go home.  PT Goal Formulation: With patient/family Time For Goal Achievement: 12/22/16 Potential to Achieve Goals: Good    Frequency     Barriers to discharge        Co-evaluation               End of Session Equipment Utilized During Treatment: Gait belt Activity Tolerance: Patient tolerated treatment well Patient left: in chair;with call bell/phone within reach;with family/visitor present Nurse Communication: Mobility status (mobility sheet left in the room. )      Time: 9528-4132 PT Time Calculation (min) (ACUTE ONLY): 32 min   Charges:   PT Evaluation $PT Eval Low Complexity: 1 Procedure PT Treatments $Gait Training: 8-22 mins   PT G Codes:   PT G-Codes **NOT FOR INPATIENT CLASS** Functional Assessment Tool Used: AM-PAC 6 Clicks Basic Mobility;Clinical judgement Functional Limitation: Mobility: Walking and moving around Mobility: Walking and Moving Around Current Status (G4010): At least 20 percent but less than 40 percent impaired, limited or restricted Mobility: Walking and Moving Around Goal Status [phone]): At least 1 percent but less than 20 percent impaired, limited or restricted    Beth Sheresa Cullop, PT, DPT X: [phone]

## 2016-12-16 ENCOUNTER — Telehealth: Payer: Self-pay

## 2016-12-16 DIAGNOSIS — J181 Lobar pneumonia, unspecified organism: Secondary | ICD-10-CM

## 2016-12-16 LAB — BASIC METABOLIC PANEL
Anion gap: 6 (ref 5–15)
BUN: 6 mg/dL (ref 6–20)
CALCIUM: 8.6 mg/dL — AB (ref 8.9–10.3)
CHLORIDE: 102 mmol/L (ref 101–111)
CO2: 29 mmol/L (ref 22–32)
CREATININE: 0.79 mg/dL (ref 0.44–1.00)
GFR calc non Af Amer: >60 mL/min (ref >60)
Glucose, Bld: 99 mg/dL (ref 65–99)
Potassium: 3.5 mmol/L (ref 3.5–5.1)
SODIUM: 137 mmol/L (ref 135–145)

## 2016-12-16 LAB — VITAMIN B12: Vitamin B-12: 1879 pg/mL — ABNORMAL HIGH (ref 180–914)

## 2016-12-16 LAB — CBC
HCT: 28.2 % — ABNORMAL LOW (ref 36.0–46.0)
Hemoglobin: 9.1 g/dL — ABNORMAL LOW (ref 12.0–15.0)
MCH: 27.4 pg (ref 26.0–34.0)
MCHC: 32.3 g/dL (ref 30.0–36.0)
MCV: 84.9 fL (ref 78.0–100.0)
PLATELETS: 310 10*3/uL (ref 150–400)
RBC: 3.32 MIL/uL — AB (ref 3.87–5.11)
RDW: 12.9 % (ref 11.5–15.5)
WBC: 4.8 10*3/uL (ref 4.0–10.5)

## 2016-12-16 MED ORDER — ONDANSETRON 4 MG PO TBDP: 4.0000 mg | ORAL_TABLET | Freq: Three times a day (TID) | ORAL | 0 refills | Status: AC | PRN

## 2016-12-16 MED ORDER — HYDROCODONE-ACETAMINOPHEN 5-325 MG PO TABS: 1.0000 | ORAL_TABLET | Freq: Four times a day (QID) | ORAL | 0 refills | Status: DC | PRN

## 2016-12-16 MED ORDER — LEVOFLOXACIN 500 MG PO TABS: 500.0000 mg | ORAL_TABLET | Freq: Every day | ORAL | 0 refills | Status: DC

## 2016-12-16 NOTE — Telephone Encounter (Signed)
Called pt., no answer. Left message for pt to return call.  

## 2016-12-16 NOTE — Telephone Encounter (Signed)
-----   Message from Antoine Poche, MD sent at 12/16/2016 10:38 AM EDT ----- Echo overall looks good. Heart pumping function is normal. There is some mild stiffness to the heart that is very common with age that can cause some SOB and swelling but is not considered a serious condition. Will discuss further at f/u  Dominga Ferry MD

## 2016-12-16 NOTE — Care Management Note (Signed)
Case Management Note  Patient Details  Name: Crystal Lopez MRN: [id number] Date of Birth: 1935/01/30  Expected Discharge Date:  12/16/16               Expected Discharge Plan:  Home w Home Health Services  In-House Referral:  NA  Discharge planning Services  CM Consult  Post Acute Care Choice:  Home Health Choice offered to:  Patient  HH Arranged:  RN, PT HH Agency:  Other - See comment [facility])  Status of Service:  Completed, signed off  Additional Comments: Pt discharging home today with HH. [facility] unable to provide services. [facility] is able to provide prompt services, pt agreeable. Pt info faxed. Pt aware HH has 48hrs to make first visit. Family at bedside for discussion of DC planning. No other needs communicated.   Malcolm Metro, RN 12/16/2016, 2:53 PM

## 2016-12-16 NOTE — Discharge Summary (Signed)
Physician Discharge Summary  Crystal Lopez ZOX:[id number] DOB: Nov 17, 1934 DOA: 12/13/2016  PCP: Crystal Person, MD  Admit date: 12/13/2016 Discharge date: 12/16/2016  Time spent: 45 minutes  Recommendations for Outpatient Follow-up:  -Will be discharged home today. -Advised to follow up with PCP in 2 weeks.   Discharge Diagnoses:  Active Problems:   CAP (community acquired pneumonia)   UTI (urinary tract infection)   Acute metabolic encephalopathy   Frequent falls   Lung nodule   Discharge Condition: Stable and improved  Filed Weights   12/13/16 1607 12/13/16 2248  Weight: 49.9 kg (110 lb) 50.2 kg (110 lb 10.7 oz)    History of present illness:  As per Crystal Lopez on 4/7: Crystal Lopez  is a 82 y.o. female, With history of hypertension, GERD who came to hospital after patient had repeated falls since last week. As per family patient had about 3 falls last week she has also been confused. She sustained injury on the face on April 6 patient fell and injured left wrist. Family also noted that patient has been more weaker than usual. Usually she is independent some degree but over the past few days she has been requiring assistance for walking. Patient though  able to provide history is mildly confused, she denies chest pain complains of cough. She also has had shortness of breath. Patient was diagnosed with pneumonia 2 months ago which was treated by PCP, and says that she hasn't felt better since then. She also admits to having dysuria. Denies nausea vomiting or diarrhea.  In the ED imaging studies were done including CT chest, head, cervical spine, lumbar spine.  CT chest showed Patchy airspace process over the right lung most probable prominent over the right upper lobe likely infection. Associated atelectasis posterior right upper lobe.  Hospital Course:   CAP -Cx data is negative at time of DC. -Will Dc on levaquin.  UTI -Cx with GNR. -Will Dc on levaquin which should  treat both CAP and UTI.  Acute Metabolic Encephalopathy -Resolved, due to acute infections.  Frequent Falls -PT: HHPT -Likely due to acute infectious processes. -TSH/B12 WNL.  Long Nodule -Will need follow up imaging as an OP in 3-6 months.  Right wrist Pain -Following fall. -Xray without fracture. -Has some edema, probably a sprain. -Have requested a wrist splint. Pain improved, swelling improved.   Procedures:  None   Consultations:  None  Discharge Instructions  Discharge Instructions    Diet - low sodium heart healthy    Complete by:  As directed    Increase activity slowly    Complete by:  As directed      Allergies as of 12/16/2016      Reactions   Codeine Hives, Shortness Of Breath, Swelling   Darvocet [propoxyphene N-acetaminophen] Hives, Shortness Of Breath, Swelling   Dilaudid [hydromorphone Hcl] Hives, Shortness Of Breath, Nausea And Vomiting   Morphine And Related Hives, Shortness Of Breath, Swelling   Boniva [ibandronate Sodium] Nausea And Vomiting   Depakote Er [divalproex Sodium Er] Nausea And Vomiting   Erythromycin Other (See Comments)   UTI   Macrodantin Nausea And Vomiting   Ropinirole Hcl Other (See Comments)   insomnia   Sulfa Antibiotics Hives, Nausea And Vomiting, Swelling   Tegretol [carbamazepine] Nausea And Vomiting   Topamax [topiramate] Hives, Swelling   Aspirin    Stomach irritation      Medication List    STOP taking these medications   furosemide 20 MG tablet  Commonly known as:  LASIX   promethazine 25 MG tablet Commonly known as:  PHENERGAN     TAKE these medications   amitriptyline 10 MG tablet Commonly known as:  ELAVIL Take 1 tablet (10 mg total) by mouth at bedtime.   benzonatate 100 MG capsule Commonly known as:  TESSALON Take 100 mg by mouth daily as needed for cough.   clonazePAM 2 MG tablet Commonly known as:  KLONOPIN Take 2 mg by mouth at bedtime.   fluticasone 50 MCG/ACT nasal  spray Commonly known as:  FLONASE Place 1 spray into both nostrils daily as needed for allergies or rhinitis.   gabapentin 300 MG capsule Commonly known as:  NEURONTIN Take 4 capsules (1,200 mg total) by mouth 3 (three) times daily. What changed:  how much to take   HYDROcodone-acetaminophen 5-325 MG tablet Commonly known as:  NORCO/VICODIN Take 1 tablet by mouth every 6 (six) hours as needed for moderate pain.   levofloxacin 500 MG tablet Commonly known as:  LEVAQUIN Take 1 tablet (500 mg total) by mouth daily.   MAXIMUM MEMORY Tabs Take 1 tablet by mouth every morning. BRAIN STRONG MEMORY SUPPORT   metoprolol tartrate 25 MG tablet Commonly known as:  LOPRESSOR Take 25 mg by mouth daily.   nitroGLYCERIN 0.3 MG SL tablet Commonly known as:  NITROSTAT Place 1 tablet (0.3 mg total) under the tongue every 5 (five) minutes as needed for chest pain.   ondansetron 4 MG disintegrating tablet Commonly known as:  ZOFRAN ODT Take 1 tablet (4 mg total) by mouth every 8 (eight) hours as needed for nausea or vomiting.   pantoprazole 40 MG tablet Commonly known as:  PROTONIX Take 40 mg by mouth 2 (two) times daily.   ranitidine 150 MG tablet Commonly known as:  ZANTAC Take 150 mg by mouth 2 (two) times daily.      Allergies  Allergen Reactions  . Codeine Hives, Shortness Of Breath and Swelling  . Darvocet [Propoxyphene N-Acetaminophen] Hives, Shortness Of Breath and Swelling  . Dilaudid [Hydromorphone Hcl] Hives, Shortness Of Breath and Nausea And Vomiting  . Morphine And Related Hives, Shortness Of Breath and Swelling  . Boniva [Ibandronate Sodium] Nausea And Vomiting  . Depakote Er [Divalproex Sodium Er] Nausea And Vomiting  . Erythromycin Other (See Comments)    UTI  . Macrodantin Nausea And Vomiting  . Ropinirole Hcl Other (See Comments)    insomnia  . Sulfa Antibiotics Hives, Nausea And Vomiting and Swelling  . Tegretol [Carbamazepine] Nausea And Vomiting  . Topamax  [Topiramate] Hives and Swelling  . Aspirin     Stomach irritation   Follow-up Information    Crystal Nadara Mustard, MD. Schedule an appointment as soon as possible for a visit in 2 week(s).   Specialty:  Internal Medicine Contact information: [address] [place] [address] [phone]        [facility] Follow up.   Contact information: Phone # [phone]           The results of significant diagnostics from this hospitalization (including imaging, microbiology, ancillary and laboratory) are listed below for reference.    Significant Diagnostic Studies: Dg Wrist Complete Right  Result Date: 12/13/2016 CLINICAL DATA:  Confusion, fall yesterday with right arm injury. EXAM: RIGHT WRIST - COMPLETE 3+ VIEW COMPARISON:  None. FINDINGS: Osteopenia limits characterization of osseous detail, however, there is no fracture line or displaced fracture fragment identified. Questionable old fracture of the distal right radius  with associated dorsal tilt. No evidence of acute osseous dislocation. Mild degenerative change at the first [facility] joint. No evidence of advanced degenerative osteoarthritis. Adjacent soft tissues are unremarkable. IMPRESSION: Osteopenia.  No acute findings. Electronically Signed   By: Bary Richard M.D.   On: 12/13/2016 17:42   Ct Head Wo Contrast  Result Date: 12/13/2016 CLINICAL DATA:  Acute onset of confusion and fever. Recent fall. Concern for cervical spine injury. Initial encounter. EXAM: CT HEAD WITHOUT CONTRAST CT CERVICAL SPINE WITHOUT CONTRAST TECHNIQUE: Multidetector CT imaging of the head and cervical spine was performed following the standard protocol without intravenous contrast. Multiplanar CT image reconstructions of the cervical spine were also generated. COMPARISON:  None. FINDINGS: CT HEAD FINDINGS Brain: No evidence of acute infarction, hemorrhage, hydrocephalus, extra-axial collection or mass lesion/mass effect. Prominence of  the ventricles and sulci reflects moderate cortical volume loss. Mild cerebellar atrophy is noted. Scattered periventricular and subcortical white matter change likely reflects small vessel ischemic microangiopathy. The brainstem and fourth ventricle are within normal limits. The basal ganglia are unremarkable in appearance. The cerebral hemispheres demonstrate grossly normal gray-white differentiation. No mass effect or midline shift is seen. Vascular: No hyperdense vessel or unexpected calcification. Skull: There is no evidence of fracture; dense sclerosis is noted at the right mastoid process. Sinuses/Orbits: The orbits are within normal limits. The paranasal sinuses and mastoid air cells are well-aerated. Other: No significant soft tissue abnormalities are seen. CT CERVICAL SPINE FINDINGS Alignment: Normal. Skull base and vertebrae: No acute fracture. No primary bone lesion or focal pathologic process. Soft tissues and spinal canal: No prevertebral fluid or swelling. No visible canal hematoma. Disc levels: Mild intervertebral disc space narrowing is noted along the mid cervical spine, with scattered anterior and posterior disc osteophyte complexes. Underlying facet disease is noted. Upper chest: Interstitial prominence is noted at the lung apices, with mild atelectasis. The thyroid gland is unremarkable in appearance. Calcification is seen at the carotid bifurcations bilaterally. Other: No additional soft tissue abnormalities are seen. IMPRESSION: 1. No evidence of traumatic intracranial injury or fracture. 2. No evidence of fracture or subluxation along the cervical spine. 3. Moderate cortical volume loss and scattered small vessel ischemic microangiopathy. 4. Mild degenerative change along the mid cervical spine. 5. Interstitial prominence at the lung apices, with mild atelectasis. 6. Calcification at the carotid bifurcations bilaterally. Carotid ultrasound would be helpful for further evaluation, when and as  deemed clinically appropriate. 7. Dense sclerosis of the right mastoid process. Electronically Signed   By: Roanna Raider M.D.   On: 12/13/2016 19:25   Ct Chest Wo Contrast  Result Date: 12/13/2016 CLINICAL DATA:  Altered mental status for 1 week. Fever beginning yesterday. Fall 12/07/2016. Fall yesterday. EXAM: CT CHEST WITHOUT CONTRAST TECHNIQUE: Multidetector CT imaging of the chest was performed following the standard protocol without IV contrast. COMPARISON:  Chest x-ray 07/24/2016 FINDINGS: Cardiovascular: Mild cardiomegaly. Calcified plaque over the left anterior descending and lateral circumflex as well as right coronary arteries. Calcified plaque over the thoracic aorta. Mediastinum/Nodes: No definite mediastinal or hilar adenopathy. Moderate size hiatal hernia. Fluid over the mid to distal esophagus which may be due to reflux or dysmotility. Lungs/Pleura: Lungs are well inflated and demonstrate a few tiny calcified granulomas bilaterally. There is a 3 mm nodule over the superior segment left lower lobe. 2 mm peripheral nodule over the left upper lobe. There is of patchy faint airspace process over the right lung most notable over the right upper lobe with associated atelectasis  over the posterior right upper lobe. Findings likely due to infection. No evidence of effusion Airways are within normal. Upper Abdomen: 2.4 cm hypodensity over the right lobe of the liver likely a cyst or hemangioma. Subcentimeter hypodensity over the left lobe likely a cyst or hemangioma. Multiple surgical clips adjacent the gastroesophageal junction. Calcified plaque over the abdominal aorta. Musculoskeletal: Degenerative change of the spine. Multiple thoracic spinal compression fractures without significant change. IMPRESSION: Patchy airspace process over the right lung most probable prominent over the right upper lobe likely infection. Associated atelectasis posterior right upper lobe. Mild cardiomegaly with 3 vessel  atherosclerotic coronary artery disease. Aortic atherosclerosis. Hiatal hernia. Fluid over the mid to distal esophagus likely due to reflux versus dysmotility. Two liver hypodensities likely cysts or hemangiomas. Multiple thoracic spine compression fractures unchanged. Electronically Signed   By: Elberta Fortis M.D.   On: 12/13/2016 19:36   Ct Cervical Spine Wo Contrast  Result Date: 12/13/2016 CLINICAL DATA:  Acute onset of confusion and fever. Recent fall. Concern for cervical spine injury. Initial encounter. EXAM: CT HEAD WITHOUT CONTRAST CT CERVICAL SPINE WITHOUT CONTRAST TECHNIQUE: Multidetector CT imaging of the head and cervical spine was performed following the standard protocol without intravenous contrast. Multiplanar CT image reconstructions of the cervical spine were also generated. COMPARISON:  None. FINDINGS: CT HEAD FINDINGS Brain: No evidence of acute infarction, hemorrhage, hydrocephalus, extra-axial collection or mass lesion/mass effect. Prominence of the ventricles and sulci reflects moderate cortical volume loss. Mild cerebellar atrophy is noted. Scattered periventricular and subcortical white matter change likely reflects small vessel ischemic microangiopathy. The brainstem and fourth ventricle are within normal limits. The basal ganglia are unremarkable in appearance. The cerebral hemispheres demonstrate grossly normal gray-white differentiation. No mass effect or midline shift is seen. Vascular: No hyperdense vessel or unexpected calcification. Skull: There is no evidence of fracture; dense sclerosis is noted at the right mastoid process. Sinuses/Orbits: The orbits are within normal limits. The paranasal sinuses and mastoid air cells are well-aerated. Other: No significant soft tissue abnormalities are seen. CT CERVICAL SPINE FINDINGS Alignment: Normal. Skull base and vertebrae: No acute fracture. No primary bone lesion or focal pathologic process. Soft tissues and spinal canal: No  prevertebral fluid or swelling. No visible canal hematoma. Disc levels: Mild intervertebral disc space narrowing is noted along the mid cervical spine, with scattered anterior and posterior disc osteophyte complexes. Underlying facet disease is noted. Upper chest: Interstitial prominence is noted at the lung apices, with mild atelectasis. The thyroid gland is unremarkable in appearance. Calcification is seen at the carotid bifurcations bilaterally. Other: No additional soft tissue abnormalities are seen. IMPRESSION: 1. No evidence of traumatic intracranial injury or fracture. 2. No evidence of fracture or subluxation along the cervical spine. 3. Moderate cortical volume loss and scattered small vessel ischemic microangiopathy. 4. Mild degenerative change along the mid cervical spine. 5. Interstitial prominence at the lung apices, with mild atelectasis. 6. Calcification at the carotid bifurcations bilaterally. Carotid ultrasound would be helpful for further evaluation, when and as deemed clinically appropriate. 7. Dense sclerosis of the right mastoid process. Electronically Signed   By: Roanna Raider M.D.   On: 12/13/2016 19:25   Ct Lumbar Spine Wo Contrast  Result Date: 12/13/2016 CLINICAL DATA:  Patient fell with legs not working properly. EXAM: CT LUMBAR SPINE WITHOUT CONTRAST TECHNIQUE: Multidetector CT imaging of the lumbar spine was performed without intravenous contrast administration. Multiplanar CT image reconstructions were also generated. COMPARISON:  None. FINDINGS: Segmentation: 5 lumbar type vertebrae.  Alignment: Normal. Vertebrae: Partially visualized superior endplate compression of T11. There are mild inferior endplate compression of T12, superior endplate compressions of L2 and L3 and inferior compressions of L4 and L5. No retropulsion is noted. No spondylolysis or spondylolisthesis. Paraspinal and other soft tissues: There is aortic atherosclerosis without aneurysm. No retroperitoneal  adenopathy. The visualized kidneys demonstrate no obstructive uropathy or nephrolithiasis. Disc levels: No focal disc herniations of the visualized thoracolumbar spine from T12-L1 through L5-S1. Kissing spinous process ease noted from L1 through L5 with reactive sclerosis consistent with Baastrup's disease. Facet joint space narrowing, sclerosis and hypertrophy are identified most prominent from L3 through S1. IMPRESSION: 1. Multilevel endplate compressions as above described involving what appear to be the superior endplate of a partially visualized T11 vertebral body, mild inferior endplate compressions of T12, L4 and L5 and superior endplate compressions of L2 and L3. No retropulsion noted. No canal stenosis is identified. 2. 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 3. Lower lumbar facet arthropathy L3 through S1 in particular. 4. No central canal stenosis or significant neural foraminal encroachment. Electronically Signed   By: Tollie Eth M.D.   On: 12/13/2016 19:34    Microbiology: Recent Results (from the past 240 hour(s))  Urine culture     Status: Abnormal (Preliminary result)   Collection Time: 12/13/16  5:00 PM  Result Value Ref Range Status   Specimen Description URINE, CATHETERIZED  Final   Special Requests NONE  Final   Culture (A)  Final    >=100,000 COLONIES/mL ESCHERICHIA COLI SUSCEPTIBILITIES TO FOLLOW Performed at [facility], [address] [place], [place] [address]    Report Status PENDING  Incomplete  Blood Culture (routine x 2)     Status: None (Preliminary result)   Collection Time: 12/13/16  5:17 PM  Result Value Ref Range Status   Specimen Description RIGHT ANTECUBITAL  Final   Special Requests   Final    BOTTLES DRAWN AEROBIC AND ANAEROBIC Blood Culture adequate volume   Culture NO GROWTH 3 DAYS  Final   Report Status PENDING  Incomplete  Blood Culture (routine x 2)     Status: None  (Preliminary result)   Collection Time: 12/13/16  5:18 PM  Result Value Ref Range Status   Specimen Description RIGHT ANTECUBITAL  Final   Special Requests Blood Culture adequate volume  Final   Culture NO GROWTH 3 DAYS  Final   Report Status PENDING  Incomplete  Culture, blood (routine x 2) Call MD if unable to obtain prior to antibiotics being given     Status: None (Preliminary result)   Collection Time: 12/13/16 10:54 PM  Result Value Ref Range Status   Specimen Description LEFT ANTECUBITAL  Final   Special Requests   Final    BOTTLES DRAWN AEROBIC AND ANAEROBIC Blood Culture adequate volume   Culture NO GROWTH 3 DAYS  Final   Report Status PENDING  Incomplete  Culture, blood (routine x 2) Call MD if unable to obtain prior to antibiotics being given     Status: None (Preliminary result)   Collection Time: 12/13/16 11:08 PM  Result Value Ref Range Status   Specimen Description RIGHT ANTECUBITAL  Final   Special Requests   Final    BOTTLES DRAWN AEROBIC AND ANAEROBIC Blood Culture adequate volume   Culture NO GROWTH 3 DAYS  Final   Report Status PENDING  Incomplete     Labs: Basic Metabolic  Panel:  Recent Labs Lab 12/13/16 1705 12/15/16 0422 12/16/16 0425  NA 136 136 137  K 4.2 3.5 3.5  CL 98* 102 102  CO2 GLUCOSE 131* 85 99  BUN CREATININE 0.86 0.73 0.79  CALCIUM 9.5 8.4* 8.6*   Liver Function Tests:  Recent Labs Lab 12/13/16 1705  AST 21  ALT 10*  ALKPHOS 31*  BILITOT 0.6  PROT 7.8  ALBUMIN 3.9   No results for input(s): LIPASE, AMYLASE in the last 168 hours.  Recent Labs Lab 12/13/16 1716  AMMONIA 7*   CBC:  Recent Labs Lab 12/13/16 1705 12/15/16 0422 12/16/16 0425  WBC 11.9* 5.9 4.8  NEUTROABS 10.6*  --   --   HGB 11.3* 8.7* 9.1*  HCT 35.0* 27.3* 28.2*  MCV 86.2 86.1 84.9  PLT 342 301 310   Cardiac Enzymes: No results for input(s): CKTOTAL, CKMB, CKMBINDEX, TROPONINI in the last 168 hours. BNP: BNP (last 3  results) No results for input(s): BNP in the last 8760 hours.  ProBNP (last 3 results) No results for input(s): PROBNP in the last 8760 hours.  CBG: No results for input(s): GLUCAP in the last 168 hours.     SignedChaya Jan  [facility] Pager: [phone] 12/16/2016, 5:34 PM

## 2016-12-16 NOTE — Progress Notes (Signed)
Discharge instructions read to patient and her family.  Both verbalized understanding of instructions. Discharged to home with family

## 2016-12-16 NOTE — Care Management Important Message (Signed)
Important Message  Patient Details  Name: Crystal Lopez MRN: [id number] Date of Birth: [birth year]   Medicare Important Message Given:  Yes    Malcolm Metro, RN 12/16/2016, 2:54 PM

## 2016-12-17 LAB — URINE CULTURE: Culture: >=100000 — AB

## 2016-12-18 LAB — CULTURE, BLOOD (ROUTINE X 2)
CULTURE: NO GROWTH
CULTURE: NO GROWTH
CULTURE: NO GROWTH
Culture: NO GROWTH
SPECIAL REQUESTS: ADEQUATE
Special Requests: ADEQUATE
Special Requests: ADEQUATE
Special Requests: ADEQUATE

## 2016-12-18 NOTE — Progress Notes (Addendum)
Pharmacy Intervention:  Ref: Antibiotics on discharge  A/P:  Patient discharged on levaquin for CAP and UTI. Sensitivities were not available at that time for review. UTI now showing E.Coli 100,000 CFU/ml was resistant to Cipro/levaquin. Contacted hospitalist Dr.Goodrich. He reviewed and called husband to let him know about  change in antibiotic to ceftin. RX called to [facility] [phone] in [place], [place] Ceftin  po BID for 3 days.  Thank you for allowing pharmacy to be a part of this patient's care.  Elder Cyphers, BS Loura Back, BCPS Clinical Pharmacist Pager [phone] 12/18/2016, 8:54 AM

## 2016-12-19 ENCOUNTER — Ambulatory Visit: Payer: Medicare Other | Admitting: Cardiology

## 2016-12-23 ENCOUNTER — Ambulatory Visit: Payer: Medicare Other | Admitting: Cardiology

## 2016-12-27 ENCOUNTER — Encounter (HOSPITAL_COMMUNITY): Payer: Self-pay | Admitting: Emergency Medicine

## 2016-12-27 ENCOUNTER — Emergency Department (HOSPITAL_COMMUNITY)
Admission: EM | Admit: 2016-12-27 | Discharge: 2016-12-27 | Disposition: A | Discharge status: Home / Self Care | Payer: Medicare Other | Attending: Emergency Medicine | Admitting: Emergency Medicine

## 2016-12-27 ENCOUNTER — Emergency Department (HOSPITAL_COMMUNITY): Payer: Medicare Other

## 2016-12-27 DIAGNOSIS — R509 Fever, unspecified: Secondary | ICD-10-CM

## 2016-12-27 DIAGNOSIS — I10 Essential (primary) hypertension: Secondary | ICD-10-CM | POA: Diagnosis not present

## 2016-12-27 DIAGNOSIS — E871 Hypo-osmolality and hyponatremia: Secondary | ICD-10-CM | POA: Diagnosis not present

## 2016-12-27 DIAGNOSIS — Z87891 Personal history of nicotine dependence: Secondary | ICD-10-CM | POA: Diagnosis not present

## 2016-12-27 DIAGNOSIS — Z79899 Other long term (current) drug therapy: Secondary | ICD-10-CM | POA: Insufficient documentation

## 2016-12-27 DIAGNOSIS — R05 Cough: Secondary | ICD-10-CM | POA: Diagnosis present

## 2016-12-27 LAB — COMPREHENSIVE METABOLIC PANEL
ALK PHOS: 28 U/L — AB (ref 38–126)
ALT: 8 U/L — AB (ref 14–54)
AST: 14 U/L — ABNORMAL LOW (ref 15–41)
Albumin: 2.9 g/dL — ABNORMAL LOW (ref 3.5–5.0)
Anion gap: 6 (ref 5–15)
BUN: 7 mg/dL (ref 6–20)
CALCIUM: 8.3 mg/dL — AB (ref 8.9–10.3)
CO2: 28 mmol/L (ref 22–32)
CREATININE: 0.84 mg/dL (ref 0.44–1.00)
Chloride: 95 mmol/L — ABNORMAL LOW (ref 101–111)
GFR calc Af Amer: >60 mL/min (ref >60)
GFR calc non Af Amer: >60 mL/min (ref >60)
GLUCOSE: 105 mg/dL — AB (ref 65–99)
Potassium: 3.4 mmol/L — ABNORMAL LOW (ref 3.5–5.1)
SODIUM: 129 mmol/L — AB (ref 135–145)
Total Bilirubin: 0.3 mg/dL (ref 0.3–1.2)
Total Protein: 5.5 g/dL — ABNORMAL LOW (ref 6.5–8.1)

## 2016-12-27 LAB — CBC WITH DIFFERENTIAL/PLATELET
BASOS PCT: 0 %
Basophils Absolute: 0 10*3/uL (ref 0.0–0.1)
EOS ABS: 0.1 10*3/uL (ref 0.0–0.7)
Eosinophils Relative: 3 %
HEMATOCRIT: 28.1 % — AB (ref 36.0–46.0)
Hemoglobin: 9.1 g/dL — ABNORMAL LOW (ref 12.0–15.0)
LYMPHS ABS: 1.1 10*3/uL (ref 0.7–4.0)
Lymphocytes Relative: 23 %
MCH: 27.1 pg (ref 26.0–34.0)
MCHC: 32.4 g/dL (ref 30.0–36.0)
MCV: 83.6 fL (ref 78.0–100.0)
MONO ABS: 0.5 10*3/uL (ref 0.1–1.0)
MONOS PCT: 11 %
NEUTROS ABS: 2.9 10*3/uL (ref 1.7–7.7)
Neutrophils Relative %: 63 %
Platelets: 250 10*3/uL (ref 150–400)
RBC: 3.36 MIL/uL — ABNORMAL LOW (ref 3.87–5.11)
RDW: 13.2 % (ref 11.5–15.5)
WBC: 4.6 10*3/uL (ref 4.0–10.5)

## 2016-12-27 LAB — URINALYSIS, ROUTINE W REFLEX MICROSCOPIC
Bilirubin Urine: NEGATIVE
Glucose, UA: NEGATIVE mg/dL
KETONES UR: NEGATIVE mg/dL
Leukocytes, UA: NEGATIVE
Nitrite: NEGATIVE
PROTEIN: NEGATIVE mg/dL
Specific Gravity, Urine: 1.004 — ABNORMAL LOW (ref 1.005–1.030)
pH: 6 (ref 5.0–8.0)

## 2016-12-27 LAB — LACTIC ACID, PLASMA: Lactic Acid, Venous: 1 mmol/L (ref 0.5–1.9)

## 2016-12-27 MED ORDER — SODIUM CHLORIDE 0.9 % IV BOLUS (SEPSIS)
1000.0000 mL | Freq: Once | INTRAVENOUS | Status: AC
  Administered 2016-12-27: 1000 mL via INTRAVENOUS

## 2016-12-27 MED ORDER — GI COCKTAIL ~~LOC~~
ORAL | Status: AC
  Filled 2016-12-27: qty 30

## 2016-12-27 MED ORDER — GI COCKTAIL ~~LOC~~
30.0000 mL | Freq: Once | ORAL | Status: AC
  Administered 2016-12-27: 30 mL via ORAL

## 2016-12-27 NOTE — ED Notes (Signed)
Pt and family given beverages.

## 2016-12-27 NOTE — ED Notes (Addendum)
Gwyn at bedside for blood draw- cultures x 2 drawn.

## 2016-12-27 NOTE — ED Notes (Signed)
Pt having acid reflux and requesting medication - Burgess Amor, PA notified.

## 2016-12-27 NOTE — ED Triage Notes (Signed)
Granddaughter states pt went to pcp yesterday for follow up of recent admission.  Still had blood in urine and was started back on Ceftin  bid.  Pt has been running a fever, coughing, and has been confused.

## 2016-12-27 NOTE — ED Provider Notes (Signed)
[facility]Y DEPT Provider Note   CSN: [id number] Arrival date & time: 12/27/16  1824     History   Chief Complaint Chief Complaint  Patient presents with  . Altered Mental Status  . Cough    HPI Crystal Lopez is a 82 y.o. female with has medical history as outlined below presenting and who was discharged from here 11 days ago during which she was treated for community-acquired pneumonia, UTI and acute encephalopathy returns today for persistent cough which has now developed yellow to green sputum production, fever to 102 earlier today, increasing weakness and mild confusion per granddaughter at bedside.  She was seen by her PCP yesterday and she had blood in her urinalysis so was started on Ceftin, has had 3 doses today.  Granddaughter has noticed some mild increasing confusion, although the patient denies these symptoms.  Granddaughter also describes that it required 2 people in order to help her ambulate to the car to travel here, at baseline she is normally ambulatory without weakness.  She has had no nausea or vomiting, denies diarrhea, chest pain, dizziness or headache.  She does endorse occasional fleeting episodes of pain across her lower abdomen which is not currently present.  She reports poor appetite since she was discharged from the hospital that has tended to maintain by mouth fluid intake.  She denies dysuria or low back or flank pain.  The history is provided by the patient and a caregiver (granddaughter who is her health care POA).  Cough  Pertinent negatives include no chest pain, no headaches, no sore throat and no shortness of breath.    Past Medical History:  Diagnosis Date  . Aspirin allergy   . Chest discomfort    Previous negative ischemic workup  . GERD (gastroesophageal reflux disease)   . Hypertension   . Migraines   . Palpitations   . Restless leg   . Rib pain    March, 2012, injury from a fall  . Sleep apnea    Suspected diagnosis    Patient Active  Problem List   Diagnosis Date Noted  . Acute metabolic encephalopathy 12/14/2016  . Frequent falls 12/14/2016  . Lung nodule 12/14/2016  . CAP (community acquired pneumonia) 12/13/2016  . UTI (urinary tract infection) 12/13/2016  . Intractable migraine 03/13/2016  . Sleep apnea   . Ejection fraction   . Aspirin allergy   . Palpitations   . GERD (gastroesophageal reflux disease)   . Chest discomfort   . Shortness of breath     Past Surgical History:  Procedure Laterality Date  . APPENDECTOMY    . EXPLORATORY LAPAROTOMY    . Hysterectomy    . L temple cut      OB History    No data available       Home Medications    Prior to Admission medications   Medication Sig Start Date End Date Taking? Authorizing Provider  amitriptyline (ELAVIL) 10 MG tablet Take 1 tablet (10 mg total) by mouth at bedtime. 03/17/16  Yes Anson Fret, MD  benzonatate (TESSALON) 100 MG capsule Take 100 mg by mouth daily as needed for cough.    Yes Historical Provider, MD  cefUROXime (CEFTIN) 250 MG tablet Take 250 mg by mouth 2 (two) times daily with a meal.   Yes Historical Provider, MD  clonazePAM (KLONOPIN) 2 MG tablet Take 2 mg by mouth at bedtime.    Yes Historical Provider, MD  fluticasone (FLONASE) 50 MCG/ACT nasal spray Place 1  spray into both nostrils daily as needed for allergies or rhinitis.   Yes Historical Provider, MD  gabapentin (NEURONTIN) 300 MG capsule Take 4 capsules (1,200 mg total) by mouth 3 (three) times daily. Patient taking differently: Take 300 mg by mouth 3 (three) times daily.  03/13/16  Yes Anson Fret, MD  HYDROcodone-acetaminophen (NORCO/VICODIN) 5-325 MG tablet Take 1 tablet by mouth every 6 (six) hours as needed for moderate pain. 12/16/16  Yes Henderson Cloud, MD  metoprolol tartrate (LOPRESSOR) 25 MG tablet Take 25 mg by mouth daily.   Yes Historical Provider, MD  nitroGLYCERIN (NITROSTAT) 0.3 MG SL tablet Place 1 tablet (0.3 mg total) under the tongue  every 5 (five) minutes as needed for chest pain. 02/10/14  Yes Luis Abed, MD  ondansetron (ZOFRAN ODT) 4 MG disintegrating tablet Take 1 tablet (4 mg total) by mouth every 8 (eight) hours as needed for nausea or vomiting. 12/16/16  Yes Estela Isaiah Blakes, MD  pantoprazole (PROTONIX) 40 MG tablet Take 40 mg by mouth 2 (two) times daily.    Yes Historical Provider, MD  ranitidine (ZANTAC) 150 MG tablet Take 150 mg by mouth 2 (two) times daily.    Yes Historical Provider, MD  levofloxacin (LEVAQUIN) 500 MG tablet Take 1 tablet (500 mg total) by mouth daily. Patient not taking: Reported on 12/27/2016 12/16/16   Henderson Cloud, MD  Misc Natural Products (MAXIMUM MEMORY) TABS Take 1 tablet by mouth every morning. BRAIN STRONG MEMORY SUPPORT    Historical Provider, MD    Family History Family History  Problem Relation Age of Onset  . Heart failure Mother   . Heart disease Father   . Heart failure Father   . Heart disease Sister   . Heart attack Sister   . Heart disease Brother   . Heart attack Brother   . Migraines Neg Hx     Social History Social History  Substance Use Topics  . Smoking status: Former Smoker    Packs/day: 2.00    Years: 7.00    Types: Cigarettes    Quit date: 09/08/1964  . Smokeless tobacco: Never Used  . Alcohol use No     Allergies   Codeine; Darvocet [propoxyphene n-acetaminophen]; Dilaudid [hydromorphone hcl]; Morphine and related; Boniva [ibandronate sodium]; Depakote er [divalproex sodium er]; Erythromycin; Macrodantin; Ropinirole hcl; Sulfa antibiotics; Tegretol [carbamazepine]; Topamax [topiramate]; and Aspirin   Review of Systems Review of Systems  Constitutional: Positive for appetite change and fever.  HENT: Negative for congestion and sore throat.   Eyes: Negative.   Respiratory: Positive for cough. Negative for chest tightness and shortness of breath.   Cardiovascular: Negative for chest pain.  Gastrointestinal: Positive for  abdominal pain. Negative for nausea and vomiting.  Genitourinary: Negative.  Negative for difficulty urinating and dysuria.  Musculoskeletal: Negative for arthralgias, joint swelling and neck pain.  Skin: Negative.  Negative for rash and wound.  Neurological: Negative for dizziness, weakness, light-headedness, numbness and headaches.  Psychiatric/Behavioral: Positive for confusion.     Physical Exam Updated Vital Signs BP 139/60   Pulse 72   Temp 98.6 F (37 C) (Oral)   Resp 18   Ht  (1.499 m)   Wt 49.9 kg   SpO2 92%   BMI 22.22 kg/m   Physical Exam  Constitutional: She is oriented to person, place, and time. She appears well-developed and well-nourished. No distress.  Pleasant female, conversive, is able to give a fair history with no  obvious confusion.  HENT:  Head: Normocephalic and atraumatic.  Mouth/Throat: Oropharynx is clear and moist.  Eyes: Conjunctivae are normal.  Neck: Normal range of motion.  Cardiovascular: Normal rate, regular rhythm, normal heart sounds and intact distal pulses.   Pulmonary/Chest: Effort normal and breath sounds normal. She has no wheezes.  Abdominal: Soft. Bowel sounds are normal. There is no tenderness. There is no rigidity, no rebound and no guarding.  Musculoskeletal: Normal range of motion.  Neurological: She is alert and oriented to person, place, and time. She has normal strength. No cranial nerve deficit or sensory deficit. She exhibits normal muscle tone. GCS eye subscore is 4. GCS verbal subscore is 5. GCS motor subscore is 6. She displays no Babinski's sign on the right side. She displays no Babinski's sign on the left side.  Equal grip strength.  Full 5 out of 5 strength in major muscle groups of legs.  Skin: Skin is warm and dry.  Psychiatric: She has a normal mood and affect.  Nursing note and vitals reviewed.    ED Treatments / Results  Labs (all labs ordered are listed, but only abnormal results are  displayed)  Results for orders placed or performed during the hospital encounter of 12/27/16  Blood Culture (routine x 2)  Result Value Ref Range   Specimen Description RIGHT ANTECUBITAL    Special Requests      Blood Culture results may not be optimal due to an inadequate volume of blood received in culture bottles   Culture PENDING    Report Status PENDING   Blood Culture (routine x 2)  Result Value Ref Range   Specimen Description BLOOD RIGHT ARM    Special Requests      Blood Culture results may not be optimal due to an inadequate volume of blood received in culture bottles   Culture PENDING    Report Status PENDING   Comprehensive metabolic panel  Result Value Ref Range   Sodium 129 (L) 135 - 145 mmol/L   Potassium 3.4 (L) 3.5 - 5.1 mmol/L   Chloride 95 (L) 101 - 111 mmol/L   CO2 28 22 - 32 mmol/L   Glucose, Bld 105 (H) 65 - 99 mg/dL   BUN 7 6 - 20 mg/dL   Creatinine, Ser 1.61 0.44 - 1.00 mg/dL   Calcium 8.3 (L) 8.9 - 10.3 mg/dL   Total Protein 5.5 (L) 6.5 - 8.1 g/dL   Albumin 2.9 (L) 3.5 - 5.0 g/dL   AST 14 (L) 15 - 41 U/L   ALT 8 (L) 14 - 54 U/L   Alkaline Phosphatase 28 (L) 38 - 126 U/L   Total Bilirubin 0.3 0.3 - 1.2 mg/dL   GFR calc non Af Amer >60 >60 mL/min   GFR calc Af Amer >60 >60 mL/min   Anion gap 6 5 - 15  CBC WITH DIFFERENTIAL  Result Value Ref Range   WBC 4.6 4.0 - 10.5 K/uL   RBC 3.36 (L) 3.87 - 5.11 MIL/uL   Hemoglobin 9.1 (L) 12.0 - 15.0 g/dL   HCT 09.6 (L) 04.5 - 40.9 %   MCV 83.6 78.0 - 100.0 fL   MCH 27.1 26.0 - 34.0 pg   MCHC 32.4 30.0 - 36.0 g/dL   RDW 81.1 91.4 - 78.2 %   Platelets 250 150 - 400 K/uL   Neutrophils Relative % 63 %   Neutro Abs 2.9 1.7 - 7.7 K/uL   Lymphocytes Relative 23 %   Lymphs Abs 1.1  0.7 - 4.0 K/uL   Monocytes Relative 11 %   Monocytes Absolute 0.5 0.1 - 1.0 K/uL   Eosinophils Relative 3 %   Eosinophils Absolute 0.1 0.0 - 0.7 K/uL   Basophils Relative 0 %   Basophils Absolute 0.0 0.0 - 0.1 K/uL  Urinalysis,  Routine w reflex microscopic  Result Value Ref Range   Color, Urine STRAW (A) YELLOW   APPearance CLEAR CLEAR   Specific Gravity, Urine 1.004 (L) 1.005 - 1.030   pH 6.0 5.0 - 8.0   Glucose, UA NEGATIVE NEGATIVE mg/dL   Hgb urine dipstick MODERATE (A) NEGATIVE   Bilirubin Urine NEGATIVE NEGATIVE   Ketones, ur NEGATIVE NEGATIVE mg/dL   Protein, ur NEGATIVE NEGATIVE mg/dL   Nitrite NEGATIVE NEGATIVE   Leukocytes, UA NEGATIVE NEGATIVE   RBC / HPF 0-5 0 - 5 RBC/hpf   WBC, UA 0-5 0 - 5 WBC/hpf   Bacteria, UA RARE (A) NONE SEEN   Squamous Epithelial / LPF 0-5 (A) NONE SEEN  Lactic acid, plasma  Result Value Ref Range   Lactic Acid, Venous 1.0 0.5 - 1.9 mmol/L      EKG  EKG Interpretation None       Radiology Dg Chest 2 View  Result Date: 12/27/2016 CLINICAL DATA:  82 year old female recently hospitalized for pneumonia in urinary tract infection presenting with fever today. EXAM: CHEST  2 VIEW COMPARISON:  Chest x-ray 07/24/2016. FINDINGS: Lung volumes are normal. No consolidative airspace disease. Scarring in the inferior aspect of the right upper lobe abutting the minor fissure, similar to the prior study. No pleural effusions. No evidence of pulmonary edema. Heart size is mildly enlarged. The patient is rotated to the right on today's exam, resulting in distortion of the mediastinal contours and reduced diagnostic sensitivity and specificity for mediastinal pathology. Atherosclerosis in the thoracic aorta. IMPRESSION: 1. No radiographic evidence of acute cardiopulmonary disease. 2. Scarring in the inferior aspect of the right upper lobe, similar to recent prior chest CT. 3. Cardiomegaly. 4. Aortic atherosclerosis. Electronically Signed   By: Trudie Reed M.D.   On: 12/27/2016 20:30    Procedures Procedures (including critical care time)  Medications Ordered in ED Medications  sodium chloride 0.9 % bolus 1,000 mL (0 mLs Intravenous Stopped 12/27/16 2047)  gi cocktail  (Maalox,Lidocaine,Donnatal) (30 mLs Oral Given 12/27/16 1948)     Initial Impression / Assessment and Plan / ED Course  I have reviewed the triage vital signs and the nursing notes.  Pertinent labs & imaging results that were available during my care of the patient were reviewed by me and considered in my medical decision making (see chart for details).     Pt with mild hyponatremia, tx with NS 1 L.  Pt without sepsis, no uti , no residual pneumonia on cxr.  Pt motivated to go home.  Discussed significance of repeat sodium level this week, advised f/u with pcp this week.  Return here for any worsening sx. Continue taking abx prescribed by pcp ytd.  Pt seen by Dr. Deretha Emory prior to dc home.  Final Clinical Impressions(s) / ED Diagnoses   Final diagnoses:  Hyponatremia  Fever, unspecified fever cause    New Prescriptions New Prescriptions   No medications on file     Burgess Amor, Cordelia Poche 12/27/16 2337    Vanetta Mulders, MD 12/31/16 [phone]

## 2016-12-27 NOTE — ED Notes (Signed)
Spoke with Gwyn, phlebotomist- she reports being aware of pt need for blood draw and cultures. Says pt is next on list for blood draw.

## 2016-12-27 NOTE — ED Notes (Signed)
Patient transported to X-ray 

## 2016-12-27 NOTE — ED Provider Notes (Signed)
ED ECG REPORT   Date: 12/27/2016  Rate: 64  Rhythm: normal sinus rhythm  QRS Axis: normal  Intervals: normal  ST/T Wave abnormalities: nonspecific ST/T changes  Conduction Disutrbances:none  Narrative Interpretation:   Old EKG Reviewed: none available  I have personally reviewed the EKG tracing and agree with the computerized printout as noted.  Some artifact in the EKG.   Vanetta Mulders, MD 12/27/16 1907

## 2016-12-27 NOTE — ED Notes (Signed)
Evaluation by PA 

## 2016-12-27 NOTE — ED Provider Notes (Signed)
Medical screening examination/treatment/procedure(s) were conducted as a shared visit with non-physician practitioner(s) and myself.  I personally evaluated the patient during the encounter.   EKG Interpretation None       Results for orders placed or performed during the hospital encounter of 12/27/16  Blood Culture (routine x 2)  Result Value Ref Range   Specimen Description RIGHT ANTECUBITAL    Special Requests      Blood Culture results may not be optimal due to an inadequate volume of blood received in culture bottles   Culture PENDING    Report Status PENDING   Blood Culture (routine x 2)  Result Value Ref Range   Specimen Description BLOOD RIGHT ARM    Special Requests      Blood Culture results may not be optimal due to an inadequate volume of blood received in culture bottles   Culture PENDING    Report Status PENDING   Comprehensive metabolic panel  Result Value Ref Range   Sodium 129 (L) 135 - 145 mmol/L   Potassium 3.4 (L) 3.5 - 5.1 mmol/L   Chloride 95 (L) 101 - 111 mmol/L   CO2 28 22 - 32 mmol/L   Glucose, Bld 105 (H) 65 - 99 mg/dL   BUN 7 6 - 20 mg/dL   Creatinine, Ser 8.29 0.44 - 1.00 mg/dL   Calcium 8.3 (L) 8.9 - 10.3 mg/dL   Total Protein 5.5 (L) 6.5 - 8.1 g/dL   Albumin 2.9 (L) 3.5 - 5.0 g/dL   AST 14 (L) 15 - 41 U/L   ALT 8 (L) 14 - 54 U/L   Alkaline Phosphatase 28 (L) 38 - 126 U/L   Total Bilirubin 0.3 0.3 - 1.2 mg/dL   GFR calc non Af Amer >60 >60 mL/min   GFR calc Af Amer >60 >60 mL/min   Anion gap 6 5 - 15  CBC WITH DIFFERENTIAL  Result Value Ref Range   WBC 4.6 4.0 - 10.5 K/uL   RBC 3.36 (L) 3.87 - 5.11 MIL/uL   Hemoglobin 9.1 (L) 12.0 - 15.0 g/dL   HCT 56.2 (L) 13.0 - 86.5 %   MCV 83.6 78.0 - 100.0 fL   MCH 27.1 26.0 - 34.0 pg   MCHC 32.4 30.0 - 36.0 g/dL   RDW 78.4 69.6 - 29.5 %   Platelets 250 150 - 400 K/uL   Neutrophils Relative % 63 %   Neutro Abs 2.9 1.7 - 7.7 K/uL   Lymphocytes Relative 23 %   Lymphs Abs 1.1 0.7 - 4.0 K/uL    Monocytes Relative 11 %   Monocytes Absolute 0.5 0.1 - 1.0 K/uL   Eosinophils Relative 3 %   Eosinophils Absolute 0.1 0.0 - 0.7 K/uL   Basophils Relative 0 %   Basophils Absolute 0.0 0.0 - 0.1 K/uL  Urinalysis, Routine w reflex microscopic  Result Value Ref Range   Color, Urine STRAW (A) YELLOW   APPearance CLEAR CLEAR   Specific Gravity, Urine 1.004 (L) 1.005 - 1.030   pH 6.0 5.0 - 8.0   Glucose, UA NEGATIVE NEGATIVE mg/dL   Hgb urine dipstick MODERATE (A) NEGATIVE   Bilirubin Urine NEGATIVE NEGATIVE   Ketones, ur NEGATIVE NEGATIVE mg/dL   Protein, ur NEGATIVE NEGATIVE mg/dL   Nitrite NEGATIVE NEGATIVE   Leukocytes, UA NEGATIVE NEGATIVE   RBC / HPF 0-5 0 - 5 RBC/hpf   WBC, UA 0-5 0 - 5 WBC/hpf   Bacteria, UA RARE (A) NONE SEEN   Squamous Epithelial /  LPF 0-5 (A) NONE SEEN  Lactic acid, plasma  Result Value Ref Range   Lactic Acid, Venous 1.0 0.5 - 1.9 mmol/L   Dg Chest 2 View  Result Date: 12/27/2016 CLINICAL DATA:  82 year old female recently hospitalized for pneumonia in urinary tract infection presenting with fever today. EXAM: CHEST  2 VIEW COMPARISON:  Chest x-ray 07/24/2016. FINDINGS: Lung volumes are normal. No consolidative airspace disease. Scarring in the inferior aspect of the right upper lobe abutting the minor fissure, similar to the prior study. No pleural effusions. No evidence of pulmonary edema. Heart size is mildly enlarged. The patient is rotated to the right on today's exam, resulting in distortion of the mediastinal contours and reduced diagnostic sensitivity and specificity for mediastinal pathology. Atherosclerosis in the thoracic aorta. IMPRESSION: 1. No radiographic evidence of acute cardiopulmonary disease. 2. Scarring in the inferior aspect of the right upper lobe, similar to recent prior chest CT. 3. Cardiomegaly. 4. Aortic atherosclerosis. Electronically Signed   By: Trudie Reed M.D.   On: 12/27/2016 20:30   Dg Wrist Complete Right  Result Date:  12/13/2016 CLINICAL DATA:  Confusion, fall yesterday with right arm injury. EXAM: RIGHT WRIST - COMPLETE 3+ VIEW COMPARISON:  None. FINDINGS: Osteopenia limits characterization of osseous detail, however, there is no fracture line or displaced fracture fragment identified. Questionable old fracture of the distal right radius with associated dorsal tilt. No evidence of acute osseous dislocation. Mild degenerative change at the first [facility] joint. No evidence of advanced degenerative osteoarthritis. Adjacent soft tissues are unremarkable. IMPRESSION: Osteopenia.  No acute findings. Electronically Signed   By: Bary Richard M.D.   On: 12/13/2016 17:42   Ct Head Wo Contrast  Result Date: 12/13/2016 CLINICAL DATA:  Acute onset of confusion and fever. Recent fall. Concern for cervical spine injury. Initial encounter. EXAM: CT HEAD WITHOUT CONTRAST CT CERVICAL SPINE WITHOUT CONTRAST TECHNIQUE: Multidetector CT imaging of the head and cervical spine was performed following the standard protocol without intravenous contrast. Multiplanar CT image reconstructions of the cervical spine were also generated. COMPARISON:  None. FINDINGS: CT HEAD FINDINGS Brain: No evidence of acute infarction, hemorrhage, hydrocephalus, extra-axial collection or mass lesion/mass effect. Prominence of the ventricles and sulci reflects moderate cortical volume loss. Mild cerebellar atrophy is noted. Scattered periventricular and subcortical white matter change likely reflects small vessel ischemic microangiopathy. The brainstem and fourth ventricle are within normal limits. The basal ganglia are unremarkable in appearance. The cerebral hemispheres demonstrate grossly normal gray-white differentiation. No mass effect or midline shift is seen. Vascular: No hyperdense vessel or unexpected calcification. Skull: There is no evidence of fracture; dense sclerosis is noted at the right mastoid process. Sinuses/Orbits: The orbits are within normal limits. The  paranasal sinuses and mastoid air cells are well-aerated. Other: No significant soft tissue abnormalities are seen. CT CERVICAL SPINE FINDINGS Alignment: Normal. Skull base and vertebrae: No acute fracture. No primary bone lesion or focal pathologic process. Soft tissues and spinal canal: No prevertebral fluid or swelling. No visible canal hematoma. Disc levels: Mild intervertebral disc space narrowing is noted along the mid cervical spine, with scattered anterior and posterior disc osteophyte complexes. Underlying facet disease is noted. Upper chest: Interstitial prominence is noted at the lung apices, with mild atelectasis. The thyroid gland is unremarkable in appearance. Calcification is seen at the carotid bifurcations bilaterally. Other: No additional soft tissue abnormalities are seen. IMPRESSION: 1. No evidence of traumatic intracranial injury or fracture. 2. No evidence of fracture or subluxation along the cervical  spine. 3. Moderate cortical volume loss and scattered small vessel ischemic microangiopathy. 4. Mild degenerative change along the mid cervical spine. 5. Interstitial prominence at the lung apices, with mild atelectasis. 6. Calcification at the carotid bifurcations bilaterally. Carotid ultrasound would be helpful for further evaluation, when and as deemed clinically appropriate. 7. Dense sclerosis of the right mastoid process. Electronically Signed   By: Roanna Raider M.D.   On: 12/13/2016 19:25   Ct Chest Wo Contrast  Result Date: 12/13/2016 CLINICAL DATA:  Altered mental status for 1 week. Fever beginning yesterday. Fall 12/07/2016. Fall yesterday. EXAM: CT CHEST WITHOUT CONTRAST TECHNIQUE: Multidetector CT imaging of the chest was performed following the standard protocol without IV contrast. COMPARISON:  Chest x-ray 07/24/2016 FINDINGS: Cardiovascular: Mild cardiomegaly. Calcified plaque over the left anterior descending and lateral circumflex as well as right coronary arteries. Calcified  plaque over the thoracic aorta. Mediastinum/Nodes: No definite mediastinal or hilar adenopathy. Moderate size hiatal hernia. Fluid over the mid to distal esophagus which may be due to reflux or dysmotility. Lungs/Pleura: Lungs are well inflated and demonstrate a few tiny calcified granulomas bilaterally. There is a 3 mm nodule over the superior segment left lower lobe. 2 mm peripheral nodule over the left upper lobe. There is of patchy faint airspace process over the right lung most notable over the right upper lobe with associated atelectasis over the posterior right upper lobe. Findings likely due to infection. No evidence of effusion Airways are within normal. Upper Abdomen: 2.4 cm hypodensity over the right lobe of the liver likely a cyst or hemangioma. Subcentimeter hypodensity over the left lobe likely a cyst or hemangioma. Multiple surgical clips adjacent the gastroesophageal junction. Calcified plaque over the abdominal aorta. Musculoskeletal: Degenerative change of the spine. Multiple thoracic spinal compression fractures without significant change. IMPRESSION: Patchy airspace process over the right lung most probable prominent over the right upper lobe likely infection. Associated atelectasis posterior right upper lobe. Mild cardiomegaly with 3 vessel atherosclerotic coronary artery disease. Aortic atherosclerosis. Hiatal hernia. Fluid over the mid to distal esophagus likely due to reflux versus dysmotility. Two liver hypodensities likely cysts or hemangiomas. Multiple thoracic spine compression fractures unchanged. Electronically Signed   By: Elberta Fortis M.D.   On: 12/13/2016 19:36   Ct Cervical Spine Wo Contrast  Result Date: 12/13/2016 CLINICAL DATA:  Acute onset of confusion and fever. Recent fall. Concern for cervical spine injury. Initial encounter. EXAM: CT HEAD WITHOUT CONTRAST CT CERVICAL SPINE WITHOUT CONTRAST TECHNIQUE: Multidetector CT imaging of the head and cervical spine was performed  following the standard protocol without intravenous contrast. Multiplanar CT image reconstructions of the cervical spine were also generated. COMPARISON:  None. FINDINGS: CT HEAD FINDINGS Brain: No evidence of acute infarction, hemorrhage, hydrocephalus, extra-axial collection or mass lesion/mass effect. Prominence of the ventricles and sulci reflects moderate cortical volume loss. Mild cerebellar atrophy is noted. Scattered periventricular and subcortical white matter change likely reflects small vessel ischemic microangiopathy. The brainstem and fourth ventricle are within normal limits. The basal ganglia are unremarkable in appearance. The cerebral hemispheres demonstrate grossly normal gray-white differentiation. No mass effect or midline shift is seen. Vascular: No hyperdense vessel or unexpected calcification. Skull: There is no evidence of fracture; dense sclerosis is noted at the right mastoid process. Sinuses/Orbits: The orbits are within normal limits. The paranasal sinuses and mastoid air cells are well-aerated. Other: No significant soft tissue abnormalities are seen. CT CERVICAL SPINE FINDINGS Alignment: Normal. Skull base and vertebrae: No acute fracture. No primary  bone lesion or focal pathologic process. Soft tissues and spinal canal: No prevertebral fluid or swelling. No visible canal hematoma. Disc levels: Mild intervertebral disc space narrowing is noted along the mid cervical spine, with scattered anterior and posterior disc osteophyte complexes. Underlying facet disease is noted. Upper chest: Interstitial prominence is noted at the lung apices, with mild atelectasis. The thyroid gland is unremarkable in appearance. Calcification is seen at the carotid bifurcations bilaterally. Other: No additional soft tissue abnormalities are seen. IMPRESSION: 1. No evidence of traumatic intracranial injury or fracture. 2. No evidence of fracture or subluxation along the cervical spine. 3. Moderate cortical  volume loss and scattered small vessel ischemic microangiopathy. 4. Mild degenerative change along the mid cervical spine. 5. Interstitial prominence at the lung apices, with mild atelectasis. 6. Calcification at the carotid bifurcations bilaterally. Carotid ultrasound would be helpful for further evaluation, when and as deemed clinically appropriate. 7. Dense sclerosis of the right mastoid process. Electronically Signed   By: Roanna Raider M.D.   On: 12/13/2016 19:25   Ct Lumbar Spine Wo Contrast  Result Date: 12/13/2016 CLINICAL DATA:  Patient fell with legs not working properly. EXAM: CT LUMBAR SPINE WITHOUT CONTRAST TECHNIQUE: Multidetector CT imaging of the lumbar spine was performed without intravenous contrast administration. Multiplanar CT image reconstructions were also generated. COMPARISON:  None. FINDINGS: Segmentation: 5 lumbar type vertebrae. Alignment: Normal. Vertebrae: Partially visualized superior endplate compression of T11. There are mild inferior endplate compression of T12, superior endplate compressions of L2 and L3 and inferior compressions of L4 and L5. No retropulsion is noted. No spondylolysis or spondylolisthesis. Paraspinal and other soft tissues: There is aortic atherosclerosis without aneurysm. No retroperitoneal adenopathy. The visualized kidneys demonstrate no obstructive uropathy or nephrolithiasis. Disc levels: No focal disc herniations of the visualized thoracolumbar spine from T12-L1 through L5-S1. Kissing spinous process ease noted from L1 through L5 with reactive sclerosis consistent with Baastrup's disease. Facet joint space narrowing, sclerosis and hypertrophy are identified most prominent from L3 through S1. IMPRESSION: 1. Multilevel endplate compressions as above described involving what appear to be the superior endplate of a partially visualized T11 vertebral body, mild inferior endplate compressions of T12, L4 and L5 and superior endplate compressions of L2 and L3.  No retropulsion noted. No canal stenosis is identified. 2. 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 3. Lower lumbar facet arthropathy L3 through S1 in particular. 4. No central canal stenosis or significant neural foraminal encroachment. Electronically Signed   By: Tollie Eth M.D.   On: 12/13/2016 19:34   Patient brought in by daughter daughter felt that there've been some increased confusion. Patient denied any of that. Patient appeared pretty functional here. Patient with some mild hyponatremia with a sodium of 129. Patient received 1 L of normal saline here. Patient really wants to go home. She states her follow-up with her primary care doctor later this week to have her sodium and labs rechecked. Rest of patient's workup without any significant abnormalities. Patient's quite functional at the moment. Patient can be discharged home.  Heart regular lungs are clear bilaterally abdomen soft nontender patient moving all extremities patient mentating well.   Vanetta Mulders, MD 12/27/16 [phone]

## 2016-12-27 NOTE — Discharge Instructions (Signed)
Complete the antibiotics you were prescribed by your doctor yesterday.

## 2016-12-27 NOTE — ED Notes (Signed)
ED Provider at bedside. 

## 2016-12-29 NOTE — Progress Notes (Signed)
Cardiology Office Note  Date: 12/30/2016   ID: Crystal Lopez, DOB [birth year], MRN [id number]  PCP: Crystal Person, MD  Primary Cardiologist: Nona Dell, MD   Chief Complaint  Patient presents with  . Cardiac follow-up    History of Present Illness: Crystal Lopez is a 82 y.o. female that I last saw back in June 2017. Record review finds office visit with Dr. Wyline Mood in March secondary to reported symptoms including chest pain, palpitations, and lower extremity edema. Follow-up echocardiogram is outlined below. She was admitted to the hospital in early April after falls at home and diagnosis of community acquired pneumonia. She was treated with Levaquin and managed on the hospitalist team. She just recently had a visit in the ER on April 21 at which time she was noted to be hyponatremic with sodium 129. She was given a liter of normal saline and discharged home.  She is here today with her granddaughter for a scheduled follow-up visit. She is not reporting any chest pain at this time. States that she is still coughing intermittently. Starting to gradually feel better.  I reviewed her recent lab work and testing as outlined below.  Past Medical History:  Diagnosis Date  . Aspirin allergy   . Chest discomfort    Previous negative ischemic workup  . GERD (gastroesophageal reflux disease)   . Hypertension   . Migraines   . Palpitations   . Restless leg   . Rib pain    March, 2012, injury from a fall  . Sleep apnea    Suspected diagnosis    Past Surgical History:  Procedure Laterality Date  . APPENDECTOMY    . EXPLORATORY LAPAROTOMY    . Hysterectomy    . L temple cut      Current Outpatient Prescriptions  Medication Sig Dispense Refill  . amitriptyline (ELAVIL) 10 MG tablet Take 1 tablet (10 mg total) by mouth at bedtime. 30 tablet 3  . benzonatate (TESSALON) 100 MG capsule Take 100 mg by mouth daily as needed for cough.     . cefUROXime (CEFTIN) 250 MG tablet Take 250  mg by mouth 2 (two) times daily with a meal.    . clonazePAM (KLONOPIN) 2 MG tablet Take 2 mg by mouth at bedtime.     . fluticasone (FLONASE) 50 MCG/ACT nasal spray Place 1 spray into both nostrils daily as needed for allergies or rhinitis.    Marland Kitchen gabapentin (NEURONTIN) 300 MG capsule Take 4 capsules (1,200 mg total) by mouth 3 (three) times daily. (Patient taking differently: Take 300 mg by mouth 3 (three) times daily. ) 270 capsule 12  . HYDROcodone-acetaminophen (NORCO/VICODIN) 5-325 MG tablet Take 1 tablet by mouth every 6 (six) hours as needed for moderate pain. 15 tablet 0  . metoprolol tartrate (LOPRESSOR) 25 MG tablet Take 25 mg by mouth daily.    . Misc Natural Products (MAXIMUM MEMORY) TABS Take 1 tablet by mouth every morning. BRAIN STRONG MEMORY SUPPORT    . nitroGLYCERIN (NITROSTAT) 0.3 MG SL tablet Place 1 tablet (0.3 mg total) under the tongue every 5 (five) minutes as needed for chest pain. 25 tablet 3  . ondansetron (ZOFRAN ODT) 4 MG disintegrating tablet Take 1 tablet (4 mg total) by mouth every 8 (eight) hours as needed for nausea or vomiting. 20 tablet 0  . pantoprazole (PROTONIX) 40 MG tablet Take 40 mg by mouth 2 (two) times daily.     . ranitidine (ZANTAC) 150 MG tablet Take 150  mg by mouth 2 (two) times daily.      No current facility-administered medications for this visit.    Allergies:  Codeine; Darvocet [propoxyphene n-acetaminophen]; Dilaudid [hydromorphone hcl]; Morphine and related; Boniva [ibandronate sodium]; Depakote er [divalproex sodium er]; Erythromycin; Macrodantin; Ropinirole hcl; Sulfa antibiotics; Tegretol [carbamazepine]; Topamax [topiramate]; and Aspirin   Social History: The patient  reports that she quit smoking about 52 years ago. Her smoking use included Cigarettes. She has a 14.00 pack-year smoking history. She has never used smokeless tobacco. She reports that she does not drink alcohol or use drugs.   ROS:  Please see the history of present illness.  Otherwise, complete review of systems is positive for anxiety.  All other systems are reviewed and negative.   Physical Exam: VS:  BP 110/68   Pulse 77   Ht  (1.575 m)   Wt 105 lb (47.6 kg)   SpO2 95%   BMI 19.20 kg/m , BMI Body mass index is 19.2 kg/m.  Wt Readings from Last 3 Encounters:  12/30/16 105 lb (47.6 kg)  12/27/16 110 lb (49.9 kg)  12/13/16 110 lb 10.7 oz (50.2 kg)    General: Thin elderly woman, appears comfortable at rest. HEENT: Conjunctiva and lids normal, oropharynx clear. Neck: Supple, no elevated JVP or carotid bruits, no thyromegaly. Lungs: Few scattered rhonchi intermittently, no wheezing or egophony, nonlabored breathing at rest. Cardiac: Regular rate and rhythm, no S3 or significant systolic murmur, no pericardial rub. Abdomen: Soft, nontender, bowel sounds present. Extremities: No pitting edema, distal pulses 2+. Musculoskeletal: No kyphosis. Neuropsychiatric: Alert and oriented 3, affect appropriate.  ECG: I personally reviewed the tracing from 12/27/2016 which showed normal sinus rhythm.  Recent Labwork: 12/15/2016: TSH 3.321 12/27/2016: ALT 8; AST 14; BUN 7; Creatinine, Ser 0.84; Hemoglobin 9.1; Platelets 250; Potassium 3.4; Sodium 129   Other Studies Reviewed Today:  Echocardiogram 12/11/2016: Study Conclusions  - Left ventricle: The cavity size was normal. Systolic function was   normal. The estimated ejection fraction was in the range of 60%   to 65%. Wall motion was normal; there were no regional wall   motion abnormalities. Doppler parameters are consistent with   abnormal left ventricular relaxation (grade 1 diastolic   dysfunction). - Aortic valve: There was mild regurgitation. - Atrial septum: No defect or patent foramen ovale was identified.  Chest x-ray 12/27/2016: FINDINGS: Lung volumes are normal. No consolidative airspace disease. Scarring in the inferior aspect of the right upper lobe abutting the minor fissure, similar to the  prior study. No pleural effusions. No evidence of pulmonary edema. Heart size is mildly enlarged. The patient is rotated to the right on today's exam, resulting in distortion of the mediastinal contours and reduced diagnostic sensitivity and specificity for mediastinal pathology. Atherosclerosis in the thoracic aorta.  IMPRESSION: 1. No radiographic evidence of acute cardiopulmonary disease. 2. Scarring in the inferior aspect of the right upper lobe, similar to recent prior chest CT. 3. Cardiomegaly. 4. Aortic atherosclerosis.  Chest CT 12/13/2016: IMPRESSION: Patchy airspace process over the right lung most probable prominent over the right upper lobe likely infection. Associated atelectasis posterior right upper lobe.  Mild cardiomegaly with 3 vessel atherosclerotic coronary artery disease. Aortic atherosclerosis.  Hiatal hernia. Fluid over the mid to distal esophagus likely due to reflux versus dysmotility.  Two liver hypodensities likely cysts or hemangiomas.  Multiple thoracic spine compression fractures unchanged.  Assessment and Plan:  1. Recent community-acquired pneumonia, completed course of antibiotics. Still coughing intermittently but no  fevers or chills. I recommended that she maintain close follow-up with PCP. Recent follow-up chest x-ray showed no residual infiltrate but some scarring within the inferior aspect of the right upper lobe.  2. Recent hyponatremia, given normal saline in the ER. No obvious offending medications. Potentially related to recent infection. Recommended that she follow-up with her PCP in the next one to 2 weeks with repeat lab work.  3. History of atypical chest pain with negative ischemic workup over time. She does have coronary artery calcifications by chest CT imaging. Recent echocardiogram shows normal LVEF. No follow-up ischemic testing at this time.  4. Essential hypertension, blood pressure is well controlled today. She is on  Lopressor.  Current medicines were reviewed with the patient today.  Disposition: Follow-up in 6 months.  Signed, Jonelle Sidle, MD, [facility] 12/30/2016 2:22 PM    [facility] at [facility] [address] [place], [place] [address] Phone: [phone]; Fax: [phone]

## 2016-12-30 ENCOUNTER — Encounter: Payer: Self-pay | Admitting: Cardiology

## 2016-12-30 ENCOUNTER — Ambulatory Visit (INDEPENDENT_AMBULATORY_CARE_PROVIDER_SITE_OTHER): Payer: Medicare Other | Admitting: Cardiology

## 2016-12-30 ENCOUNTER — Ambulatory Visit: Payer: Medicare Other | Admitting: Cardiology

## 2016-12-30 VITALS — BP 110/68 | HR 77 | Ht 62.0 in | Wt 105.0 lb

## 2016-12-30 DIAGNOSIS — I10 Essential (primary) hypertension: Secondary | ICD-10-CM | POA: Diagnosis not present

## 2016-12-30 DIAGNOSIS — R0789 Other chest pain: Secondary | ICD-10-CM

## 2016-12-30 DIAGNOSIS — E871 Hypo-osmolality and hyponatremia: Secondary | ICD-10-CM | POA: Diagnosis not present

## 2016-12-30 DIAGNOSIS — Z8701 Personal history of pneumonia (recurrent): Secondary | ICD-10-CM | POA: Diagnosis not present

## 2016-12-30 NOTE — Patient Instructions (Signed)
Your physician wants you to follow-up in: 6 months in [place] office with Dr Randa Spike will receive a reminder letter in the mail two months in advance. If you don't receive a letter, please call our office to schedule the follow-up appointment.    Your physician recommends that you continue on your current medications as directed. Please refer to the Current Medication list given to you today.    If you need a refill on your cardiac medications before your next appointment, please call your pharmacy.       Thank you for choosing [facility] !

## 2017-01-01 LAB — CULTURE, BLOOD (ROUTINE X 2)
Culture: NO GROWTH
Culture: NO GROWTH

## 2017-07-01 NOTE — Progress Notes (Signed)
Cardiology Office Note  Date: 07/06/2017   ID: Crystal Lopez, DOB 05/22/1935, MRN [id number]  PCP: Crystal PersonAaron, Caren T, MD  Primary Cardiologist: Nona DellSamuel McDowell, MD   Chief Complaint  Patient presents with  . Cardiac follow-up    History of Present Illness: Crystal Lopez is an 82 y.o. female last seen in April.  She is here with her husband today for a follow-up visit.  She states that about 2 weeks ago she underwent spine surgery in [place] (sounds like it may have been a kyphoplasty).  She is using a cane, sometimes a walker at home.  She reports trouble with her balance and propensity to falls.  She does not report chest pain.  Does have a sense of palpitations at times, usually in the evenings.  This has been a long-term, recurring complaint.  She has had no syncope associated with this.  I reviewed her medications which are outlined below and stable.  She continues on Lopressor.  Past Medical History:  Diagnosis Date  . Aspirin allergy   . Chest discomfort    Previous negative ischemic workup  . GERD (gastroesophageal reflux disease)   . Hypertension   . Migraines   . Palpitations   . Restless leg   . Rib pain    March, 2012, injury from a fall  . Sleep apnea    Suspected diagnosis    Past Surgical History:  Procedure Laterality Date  . APPENDECTOMY    . EXPLORATORY LAPAROTOMY    . Hysterectomy    . L temple cut      Current Outpatient Prescriptions  Medication Sig Dispense Refill  . amitriptyline (ELAVIL) 10 MG tablet Take 1 tablet (10 mg total) by mouth at bedtime. 30 tablet 3  . benzonatate (TESSALON) 100 MG capsule Take 100 mg by mouth daily as needed for cough.     . cefUROXime (CEFTIN) 250 MG tablet Take 250 mg by mouth 2 (two) times daily with a meal.    . clonazePAM (KLONOPIN) 2 MG tablet Take 2 mg by mouth at bedtime.     . fluticasone (FLONASE) 50 MCG/ACT nasal spray Place 1 spray into both nostrils daily as needed for allergies or rhinitis.    Marland Kitchen.  gabapentin (NEURONTIN) 300 MG capsule Take 4 capsules (1,200 mg total) by mouth 3 (three) times daily. (Patient taking differently: Take 300 mg by mouth 3 (three) times daily. ) 270 capsule 12  . HYDROcodone-acetaminophen (NORCO/VICODIN) 5-325 MG tablet Take 1 tablet by mouth every 6 (six) hours as needed for moderate pain. 15 tablet 0  . metoprolol tartrate (LOPRESSOR) 25 MG tablet Take 25 mg by mouth daily.    . Misc Natural Products (MAXIMUM MEMORY) TABS Take 1 tablet by mouth every morning. BRAIN STRONG MEMORY SUPPORT    . nitroGLYCERIN (NITROSTAT) 0.3 MG SL tablet Place 1 tablet (0.3 mg total) under the tongue every 5 (five) minutes as needed for chest pain. 25 tablet 3  . ondansetron (ZOFRAN ODT) 4 MG disintegrating tablet Take 1 tablet (4 mg total) by mouth every 8 (eight) hours as needed for nausea or vomiting. 20 tablet 0  . pantoprazole (PROTONIX) 40 MG tablet Take 40 mg by mouth 2 (two) times daily.     . ranitidine (ZANTAC) 150 MG tablet Take 150 mg by mouth 2 (two) times daily.      No current facility-administered medications for this visit.    Allergies:  Codeine; Darvocet [propoxyphene n-acetaminophen]; Dilaudid [hydromorphone hcl]; Morphine  and related; Boniva [ibandronate sodium]; Depakote er [divalproex sodium er]; Erythromycin; Macrodantin; Ropinirole hcl; Sulfa antibiotics; Tegretol [carbamazepine]; Topamax [topiramate]; and Aspirin   Social History: The patient  reports that she quit smoking about 52 years ago. Her smoking use included Cigarettes. She has a 14.00 pack-year smoking history. She has never used smokeless tobacco. She reports that she does not drink alcohol or use drugs.   ROS:  Please see the history of present illness. Otherwise, complete review of systems is positive for improving back pain.  All other systems are reviewed and negative.   Physical Exam: VS:  BP (!) 148/82   Pulse 92   Ht 5\' 2"  (1.575 m)   Wt 101 lb (45.8 kg)   SpO2 98%   BMI 18.47 kg/m ,  BMI Body mass index is 18.47 kg/m.  Wt Readings from Last 3 Encounters:  07/06/17 101 lb (45.8 kg)  12/30/16 105 lb (47.6 kg)  12/27/16 110 lb (49.9 kg)    General: Thin elderly woman, appears comfortable at rest.  Using a cane. HEENT: Conjunctiva and lids normal, oropharynx clear. Neck: Supple, no elevated JVP or carotid bruits, no thyromegaly. Lungs: Clear to auscultation, nonlabored breathing at rest. Cardiac: Regular rate and rhythm, no S3 or significant systolic murmur, no pericardial rub. Abdomen: Soft, nontender, no hepatomegaly, bowel sounds present, no guarding or rebound. Extremities: No pitting edema, distal pulses 2+.  ECG: I personally reviewed the tracing from 12/27/2016 which showed sinus rhythm.  Recent Labwork: 12/15/2016: TSH 3.321 12/27/2016: ALT 8; AST 14; BUN 7; Creatinine, Ser 0.84; Hemoglobin 9.1; Platelets 250; Potassium 3.4; Sodium 129   Other Studies Reviewed Today:  Echocardiogram 12/11/2016: Study Conclusions  - Left ventricle: The cavity size was normal. Systolic function was   normal. The estimated ejection fraction was in the range of 60%   to 65%. Wall motion was normal; there were no regional wall   motion abnormalities. Doppler parameters are consistent with   abnormal left ventricular relaxation (grade 1 diastolic   dysfunction). - Aortic valve: There was mild regurgitation. - Atrial septum: No defect or patent foramen ovale was identified.  Assessment and Plan:  1.  History of palpitations.  No progression.  She has undergone previous reassuring ischemic evaluation and has normal LVEF by echocardiogram updated earlier this year.  Continue Lopressor and observation.  2.  Essential hypertension, keep follow-up with PCP.  No changes made in current regimen.  Current medicines were reviewed with the patient today.  Disposition: Follow-up in 6 months.  Signed, Jonelle Sidle, MD, [facility] 07/06/2017 12:10 PM    [facility]  HeartCare at [facility] [address] [place], [place], [place] [address] Phone: [phone]; Fax: [phone]

## 2017-07-06 ENCOUNTER — Encounter: Payer: Self-pay | Admitting: Cardiology

## 2017-07-06 ENCOUNTER — Ambulatory Visit (INDEPENDENT_AMBULATORY_CARE_PROVIDER_SITE_OTHER): Payer: Medicare Other | Admitting: Cardiology

## 2017-07-06 VITALS — BP 148/82 | HR 92 | Ht 62.0 in | Wt 101.0 lb

## 2017-07-06 DIAGNOSIS — R002 Palpitations: Secondary | ICD-10-CM

## 2017-07-06 DIAGNOSIS — I10 Essential (primary) hypertension: Secondary | ICD-10-CM

## 2017-07-06 NOTE — Patient Instructions (Signed)

## 2018-01-10 NOTE — Progress Notes (Signed)
Cardiology Office Note  Date: 01/11/2018   ID: JERIANNE ANSELMO, DOB 1934/11/20, MRN [id number]  PCP: Bedelia Person, MD  Primary Cardiologist: Nona Dell, MD   Chief Complaint  Patient presents with  . Follow-up palpitations    History of Present Illness: ALANDA COLTON is an 83 y.o. female last seen in October 2018.  She is here with her husband.  She continues to report an atypical left thoracic discomfort as well as sense of palpitations, usually at nighttime.  Beta-blocker dose was just recently increased by PCP.  She takes her metoprolol in the morning.  Current medications include Toprol-XL 50 mg daily.  She uses a cane at home, states that she is prone to falls.  Also has chronic back pain.  I personally reviewed her ECG today which shows normal sinus rhythm.  Past Medical History:  Diagnosis Date  . Aspirin allergy   . Chest discomfort    Previous negative ischemic workup  . GERD (gastroesophageal reflux disease)   . Hypertension   . Migraines   . Palpitations   . Restless leg   . Rib pain    March, 2012, injury from a fall  . Sleep apnea    Suspected diagnosis    Past Surgical History:  Procedure Laterality Date  . APPENDECTOMY    . EXPLORATORY LAPAROTOMY    . Hysterectomy    . L temple cut      Current Outpatient Medications  Medication Sig Dispense Refill  . amitriptyline (ELAVIL) 10 MG tablet Take 1 tablet (10 mg total) by mouth at bedtime. 30 tablet 3  . benzonatate (TESSALON) 100 MG capsule Take 100 mg by mouth daily as needed for cough.     . cefUROXime (CEFTIN) 250 MG tablet Take 250 mg by mouth 2 (two) times daily with a meal.    . clonazePAM (KLONOPIN) 2 MG tablet Take 2 mg by mouth at bedtime.     . fluticasone (FLONASE) 50 MCG/ACT nasal spray Place 1 spray into both nostrils daily as needed for allergies or rhinitis.    Marland Kitchen gabapentin (NEURONTIN) 300 MG capsule Take 4 capsules (1,200 mg total) by mouth 3 (three) times daily. (Patient taking  differently: Take 300 mg by mouth 3 (three) times daily. ) 270 capsule 12  . HYDROcodone-acetaminophen (NORCO/VICODIN) 5-325 MG tablet Take 1 tablet by mouth every 6 (six) hours as needed for moderate pain. 15 tablet 0  . metoprolol succinate (TOPROL-XL) 50 MG 24 hr tablet Take 50 mg by mouth daily. Take with or immediately following a meal.    . Misc Natural Products (MAXIMUM MEMORY) TABS Take 1 tablet by mouth every morning. BRAIN STRONG MEMORY SUPPORT    . nitroGLYCERIN (NITROSTAT) 0.3 MG SL tablet Place 1 tablet (0.3 mg total) under the tongue every 5 (five) minutes as needed for chest pain. 25 tablet 3  . ondansetron (ZOFRAN ODT) 4 MG disintegrating tablet Take 1 tablet (4 mg total) by mouth every 8 (eight) hours as needed for nausea or vomiting. 20 tablet 0  . pantoprazole (PROTONIX) 40 MG tablet Take 40 mg by mouth 2 (two) times daily.     . ranitidine (ZANTAC) 150 MG tablet Take 150 mg by mouth 2 (two) times daily.      No current facility-administered medications for this visit.    Allergies:  Codeine; Darvocet [propoxyphene n-acetaminophen]; Dilaudid [hydromorphone hcl]; Morphine and related; Boniva [ibandronate sodium]; Depakote er [divalproex sodium er]; Erythromycin; Macrodantin; Ropinirole hcl; Sulfa antibiotics;  Tegretol [carbamazepine]; Topamax [topiramate]; and Aspirin   Social History: The patient  reports that she quit smoking about 53 years ago. Her smoking use included cigarettes. She has a 14.00 pack-year smoking history. She has never used smokeless tobacco. She reports that she does not drink alcohol or use drugs.   ROS:  Please see the history of present illness. Otherwise, complete review of systems is positive for hearing loss.  All other systems are reviewed and negative.   Physical Exam: VS:  BP 122/80   Pulse 72   Ht  (1.575 m)   Wt 104 lb (47.2 kg)   SpO2 98%   BMI 19.02 kg/m , BMI Body mass index is 19.02 kg/m.  Wt Readings from Last 3 Encounters:    01/11/18 104 lb (47.2 kg)  07/06/17 101 lb (45.8 kg)  12/30/16 105 lb (47.6 kg)    General: Elderly woman, appears comfortable at rest. HEENT: Conjunctiva and lids normal, oropharynx clear. Neck: Supple, no elevated JVP or carotid bruits, no thyromegaly. Lungs: Clear to auscultation, nonlabored breathing at rest. Cardiac: Regular rate and rhythm, no S3 or significant systolic murmur, no pericardial rub. Abdomen: Soft, nontender, bowel sounds present. Extremities: No pitting edema, distal pulses 2+.  ECG: I personally reviewed the tracing from 12/27/2016 which showed sinus rhythm.  Recent Labwork:  12/15/2016: TSH 3.321 12/27/2016: ALT 8; AST 14; BUN 7; Creatinine, Ser 0.84; Hemoglobin 9.1; Platelets 250; Potassium 3.4; Sodium 129   Other Studies Reviewed Today:   Echocardiogram 12/11/2016: Study Conclusions  - Left ventricle: The cavity size was normal. Systolic function was normal. The estimated ejection fraction was in the range of 60% to 65%. Wall motion was normal; there were no regional wall motion abnormalities. Doppler parameters are consistent with abnormal left ventricular relaxation (grade 1 diastolic dysfunction). - Aortic valve: There was mild regurgitation. - Atrial septum: No defect or patent foramen ovale was identified.  Assessment and Plan:  1.  Palpitations and atypical left sided thoracic discomfort.  Would recommend continuing observation, agree with recent increase in Toprol-XL.  Her ECG is normal today.  Since palpitations are mainly nocturnal, could consider having her take the Toprol-XL in the evening, or even dividing the dose to twice daily.  She will keep follow-up with PCP in the interim.  2.  Essential hypertension, blood pressure is better controlled today.  Current medicines were reviewed with the patient today.   Orders Placed This Encounter  Procedures  . EKG 12-Lead    Disposition: Follow-up in 6 months.  Signed, Jonelle Sidle, MD, [facility] 01/11/2018 11:26 AM    [facility] at [facility] [address] [place], [place], [place] [address] Phone: [phone]; Fax: [phone]

## 2018-01-11 ENCOUNTER — Encounter: Payer: Self-pay | Admitting: Cardiology

## 2018-01-11 ENCOUNTER — Ambulatory Visit (INDEPENDENT_AMBULATORY_CARE_PROVIDER_SITE_OTHER): Payer: Medicare Other | Admitting: Cardiology

## 2018-01-11 VITALS — BP 122/80 | HR 72 | Ht 62.0 in | Wt 104.0 lb

## 2018-01-11 DIAGNOSIS — I10 Essential (primary) hypertension: Secondary | ICD-10-CM | POA: Diagnosis not present

## 2018-01-11 DIAGNOSIS — R002 Palpitations: Secondary | ICD-10-CM | POA: Diagnosis not present

## 2018-01-11 NOTE — Patient Instructions (Signed)

## 2018-03-01 ENCOUNTER — Emergency Department (HOSPITAL_COMMUNITY)
Admission: EM | Admit: 2018-03-01 | Discharge: 2018-03-01 | Disposition: A | Discharge status: Home / Self Care | Payer: Medicare Other | Attending: Emergency Medicine | Admitting: Emergency Medicine

## 2018-03-01 ENCOUNTER — Emergency Department (HOSPITAL_COMMUNITY): Payer: Medicare Other

## 2018-03-01 ENCOUNTER — Encounter (HOSPITAL_COMMUNITY): Payer: Self-pay

## 2018-03-01 DIAGNOSIS — Z87891 Personal history of nicotine dependence: Secondary | ICD-10-CM | POA: Diagnosis not present

## 2018-03-01 DIAGNOSIS — Z79899 Other long term (current) drug therapy: Secondary | ICD-10-CM | POA: Insufficient documentation

## 2018-03-01 DIAGNOSIS — N390 Urinary tract infection, site not specified: Secondary | ICD-10-CM | POA: Insufficient documentation

## 2018-03-01 DIAGNOSIS — M25511 Pain in right shoulder: Secondary | ICD-10-CM | POA: Diagnosis present

## 2018-03-01 DIAGNOSIS — I10 Essential (primary) hypertension: Secondary | ICD-10-CM | POA: Diagnosis not present

## 2018-03-01 DIAGNOSIS — R2681 Unsteadiness on feet: Secondary | ICD-10-CM | POA: Insufficient documentation

## 2018-03-01 HISTORY — DX: Deficiency of other specified B group vitamins: E53.8

## 2018-03-01 HISTORY — DX: Fracture of unspecified part of neck of unspecified femur, initial encounter for closed fracture: S72.009A

## 2018-03-01 HISTORY — DX: Anxiety disorder, unspecified: F41.9

## 2018-03-01 HISTORY — DX: Age-related osteoporosis without current pathological fracture: M81.0

## 2018-03-01 HISTORY — DX: Overactive bladder: N32.81

## 2018-03-01 HISTORY — DX: Unsteadiness on feet: R26.81

## 2018-03-01 HISTORY — DX: Other cervical disc degeneration, unspecified cervical region: M50.30

## 2018-03-01 LAB — CBC WITH DIFFERENTIAL/PLATELET
BASOS ABS: 0 10*3/uL (ref 0.0–0.1)
BASOS PCT: 0 %
EOS PCT: 1 %
Eosinophils Absolute: 0.1 10*3/uL (ref 0.0–0.7)
HCT: 32.8 % — ABNORMAL LOW (ref 36.0–46.0)
Hemoglobin: 10.2 g/dL — ABNORMAL LOW (ref 12.0–15.0)
LYMPHS PCT: 21 %
Lymphs Abs: 1.9 10*3/uL (ref 0.7–4.0)
MCH: 26.8 pg (ref 26.0–34.0)
MCHC: 31.1 g/dL (ref 30.0–36.0)
MCV: 86.1 fL (ref 78.0–100.0)
Monocytes Absolute: 0.8 10*3/uL (ref 0.1–1.0)
Monocytes Relative: 9 %
Neutro Abs: 6.3 10*3/uL (ref 1.7–7.7)
Neutrophils Relative %: 69 %
PLATELETS: 327 10*3/uL (ref 150–400)
RBC: 3.81 MIL/uL — AB (ref 3.87–5.11)
RDW: 13.3 % (ref 11.5–15.5)
WBC: 9.1 10*3/uL (ref 4.0–10.5)

## 2018-03-01 LAB — URINALYSIS, ROUTINE W REFLEX MICROSCOPIC
Bilirubin Urine: NEGATIVE
GLUCOSE, UA: NEGATIVE mg/dL
Ketones, ur: NEGATIVE mg/dL
NITRITE: POSITIVE — AB
Protein, ur: NEGATIVE mg/dL
Specific Gravity, Urine: 1.009 (ref 1.005–1.030)
WBC, UA: >50 WBC/hpf — ABNORMAL HIGH (ref 0–5)
pH: 7 (ref 5.0–8.0)

## 2018-03-01 LAB — BASIC METABOLIC PANEL
ANION GAP: 8 (ref 5–15)
BUN: 7 mg/dL (ref 6–20)
CO2: 26 mmol/L (ref 22–32)
Calcium: 8.6 mg/dL — ABNORMAL LOW (ref 8.9–10.3)
Chloride: 98 mmol/L — ABNORMAL LOW (ref 101–111)
Creatinine, Ser: 0.71 mg/dL (ref 0.44–1.00)
GFR calc Af Amer: >60 mL/min (ref >60)
Glucose, Bld: 102 mg/dL — ABNORMAL HIGH (ref 65–99)
POTASSIUM: 3.9 mmol/L (ref 3.5–5.1)
SODIUM: 132 mmol/L — AB (ref 135–145)

## 2018-03-01 LAB — TROPONIN I

## 2018-03-01 MED ORDER — CEPHALEXIN 250 MG PO CAPS: 250.0000 mg | ORAL_CAPSULE | Freq: Four times a day (QID) | ORAL | 0 refills | Status: DC

## 2018-03-01 MED ORDER — HYDROCODONE-ACETAMINOPHEN 5-325 MG PO TABS
1.0000 | ORAL_TABLET | Freq: Once | ORAL | Status: AC
  Administered 2018-03-01: 1 via ORAL
  Filled 2018-03-01: qty 1

## 2018-03-01 MED ORDER — CEPHALEXIN 250 MG PO CAPS
250.0000 mg | ORAL_CAPSULE | Freq: Once | ORAL | Status: AC
  Administered 2018-03-01: 250 mg via ORAL
  Filled 2018-03-01 (×2): qty 1

## 2018-03-01 NOTE — Discharge Instructions (Addendum)
Your evaluated in the emergency department for right shoulder pain after it was strained preventing you from a fall.  Your x-rays did not show any obvious fracture.  You also had testing done and showed you had a urinary tract infection.  We are prescribing you some antibiotics for that.  We are trying to set you up for physical therapy but you should also contact your primary care doctor and let them know.  Please return if any worsening symptoms.

## 2018-03-01 NOTE — ED Provider Notes (Signed)
[facility] EMERGENCY DEPARTMENT Provider Note   CSN: [id number] Arrival date & time: 03/01/18  1256     History   Chief Complaint Chief Complaint  Patient presents with  . Shoulder Pain    HPI Crystal Lopez is a 83 y.o. female.  She is complaining of 3 months of increased unsteadiness when she ambulates.  She states she has a cane and a walker but has these episodes where she is either running forward or staggering backwards and feels very unsafe at home.  She had another episode on Saturday which she lost her balance and her husband grabbed her right arm to keep him from falling.  Since then she has had severe right shoulder pain increased with movement.  She is taken some hydrocodone but it does not seem to be helping.  There is been no fever no chest pain no shortness of breath no headache no abdominal pain vomiting or diarrhea.  No urinary symptoms.  The history is provided by the patient.  Shoulder Injury  This is a new problem. The current episode started 2 days ago. The problem occurs constantly. The problem has not changed since onset.Pertinent negatives include no chest pain, no abdominal pain, no headaches and no shortness of breath. The symptoms are aggravated by bending and twisting. Nothing relieves the symptoms. She has tried rest for the symptoms. The treatment provided no relief.    Past Medical History:  Diagnosis Date  . Anxiety   . Aspirin allergy   . B12 deficiency   . Chest discomfort    Previous negative ischemic workup  . DDD (degenerative disc disease), cervical   . Gait instability   . GERD (gastroesophageal reflux disease)   . Hip fx (HCC)   . Hypertension   . Migraines   . Osteoporosis   . Overactive bladder   . Palpitations   . Restless leg   . Rib pain    March, 2012, injury from a fall  . Sleep apnea    Suspected diagnosis    Patient Active Problem List   Diagnosis Date Noted  . Acute metabolic encephalopathy 12/14/2016  . Frequent  falls 12/14/2016  . Lung nodule 12/14/2016  . CAP (community acquired pneumonia) 12/13/2016  . UTI (urinary tract infection) 12/13/2016  . Intractable migraine 03/13/2016  . Sleep apnea   . Ejection fraction   . Aspirin allergy   . Palpitations   . GERD (gastroesophageal reflux disease)   . Chest discomfort   . Shortness of breath     Past Surgical History:  Procedure Laterality Date  . APPENDECTOMY    . EXPLORATORY LAPAROTOMY    . Hysterectomy    . L temple cut       OB History   None      Home Medications    Prior to Admission medications   Medication Sig Start Date End Date Taking? Authorizing Provider  amitriptyline (ELAVIL) 10 MG tablet Take 1 tablet (10 mg total) by mouth at bedtime. 03/17/16   Anson Fret, MD  benzonatate (TESSALON) 100 MG capsule Take 100 mg by mouth daily as needed for cough.     [provider]  cefUROXime (CEFTIN) 250 MG tablet Take 250 mg by mouth 2 (two) times daily with a meal.    [provider]  clonazePAM (KLONOPIN) 2 MG tablet Take 2 mg by mouth at bedtime.     [provider]  fluticasone (FLONASE) 50 MCG/ACT nasal spray Place 1 spray  into both nostrils daily as needed for allergies or rhinitis.    [provider]  gabapentin (NEURONTIN) 300 MG capsule Take 4 capsules (1,200 mg total) by mouth 3 (three) times daily. Patient taking differently: Take 300 mg by mouth 3 (three) times daily.  03/13/16   Anson FretAhern, Antonia B, MD  HYDROcodone-acetaminophen (NORCO/VICODIN) 5-325 MG tablet Take 1 tablet by mouth every 6 (six) hours as needed for moderate pain. 12/16/16   Philip AspenHernandez Acosta, Limmie PatriciaEstela Y, MD  metoprolol succinate (TOPROL-XL) 50 MG 24 hr tablet Take 50 mg by mouth daily. Take with or immediately following a meal.    [provider]  Misc Natural Products (MAXIMUM MEMORY) TABS Take 1 tablet by mouth every morning. BRAIN STRONG MEMORY SUPPORT    [provider]  nitroGLYCERIN (NITROSTAT) 0.3  MG SL tablet Place 1 tablet (0.3 mg total) under the tongue every 5 (five) minutes as needed for chest pain. 02/10/14   Luis AbedKatz, Jeffrey D, MD  ondansetron (ZOFRAN ODT) 4 MG disintegrating tablet Take 1 tablet (4 mg total) by mouth every 8 (eight) hours as needed for nausea or vomiting. 12/16/16   Philip AspenHernandez Acosta, Limmie PatriciaEstela Y, MD  pantoprazole (PROTONIX) 40 MG tablet Take 40 mg by mouth 2 (two) times daily.     [provider]  ranitidine (ZANTAC) 150 MG tablet Take 150 mg by mouth 2 (two) times daily.     [provider]    Family History Family History  Problem Relation Age of Onset  . Heart failure Mother   . Heart disease Father   . Heart failure Father   . Heart disease Sister   . Heart attack Sister   . Heart disease Brother   . Heart attack Brother   . Migraines Neg Hx     Social History Social History   Tobacco Use  . Smoking status: Former Smoker    Packs/day: 2.00    Years: 7.00    Pack years: 14.00    Types: Cigarettes    Last attempt to quit: 09/08/1964    Years since quitting: 53.5  . Smokeless tobacco: Never Used  Substance Use Topics  . Alcohol use: No    Alcohol/week: 0.0 oz  . Drug use: No     Allergies   Codeine; Darvocet [propoxyphene n-acetaminophen]; Dilaudid [hydromorphone hcl]; Morphine and related; Boniva [ibandronate sodium]; Depakote er [divalproex sodium er]; Erythromycin; Macrodantin; Ropinirole hcl; Sulfa antibiotics; Tegretol [carbamazepine]; Topamax [topiramate]; and Aspirin   Review of Systems Review of Systems  Constitutional: Negative for chills and fever.  HENT: Negative for sore throat.   Eyes: Negative for visual disturbance.  Respiratory: Negative for shortness of breath.   Cardiovascular: Negative for chest pain.  Gastrointestinal: Negative for abdominal pain.  Genitourinary: Negative for dysuria.  Musculoskeletal: Positive for gait problem. Negative for neck pain.  Skin: Negative for rash and wound.  Neurological:  Negative for headaches.     Physical Exam Updated Vital Signs BP (!) 121/55   Pulse 80   Temp 99 F (37.2 C) (Oral)   Resp 16   Ht 4\' 11"  (1.499 m)   Wt 46.7 kg (103 lb)   SpO2 96%   BMI 20.80 kg/m   Physical Exam  Constitutional: She appears well-developed and well-nourished. No distress.  HENT:  Head: Normocephalic and atraumatic.  Right Ear: External ear normal.  Left Ear: External ear normal.  Mouth/Throat: Oropharynx is clear and moist.  Eyes: Pupils are equal, round, and reactive to light. Conjunctivae are  normal.  Neck: Normal range of motion. Neck supple.  Cardiovascular: Normal rate, regular rhythm, normal heart sounds and intact distal pulses.  No murmur heard. Pulmonary/Chest: Effort normal and breath sounds normal. No stridor. No respiratory distress. She has no wheezes.  Abdominal: Soft. There is no tenderness.  Musculoskeletal: Normal range of motion. She exhibits tenderness. She exhibits no edema.  Right shoulder diffuse increased over her scapula.  No crepitus.  Normal landmarks.  Neurological: She is alert. GCS eye subscore is 4. GCS verbal subscore is 5. GCS motor subscore is 6.  Skin: Skin is warm and dry. Capillary refill takes less than 2 seconds.  Psychiatric: She has a normal mood and affect.  Nursing note and vitals reviewed.    ED Treatments / Results  Labs (all labs ordered are listed, but only abnormal results are displayed) Labs Reviewed  CBC WITH DIFFERENTIAL/PLATELET - Abnormal; Notable for the following components:      Result Value   RBC 3.81 (*)    Hemoglobin 10.2 (*)    HCT 32.8 (*)    All other components within normal limits  BASIC METABOLIC PANEL - Abnormal; Notable for the following components:   Sodium 132 (*)    Chloride 98 (*)    Glucose, Bld 102 (*)    Calcium 8.6 (*)    All other components within normal limits  URINALYSIS, ROUTINE W REFLEX MICROSCOPIC - Abnormal; Notable for the following components:   APPearance  CLOUDY (*)    Hgb urine dipstick MODERATE (*)    Nitrite POSITIVE (*)    Leukocytes, UA LARGE (*)    WBC, UA >50 (*)    Bacteria, UA MANY (*)    All other components within normal limits  URINE CULTURE  TROPONIN I    EKG EKG Interpretation  Date/Time:  Monday March 01 2018 15:42:35 EDT Ventricular Rate:  73 PR Interval:    QRS Duration: 92 QT Interval:  414 QTC Calculation: 457 R Axis:   13 Text Interpretation:  Sinus rhythm Abnormal R-wave progression, early transition no acute st.ts Confirmed by Meridee Score [id number]) on 03/01/2018 3:51:23 PM   Radiology Dg Shoulder Right  Result Date: 03/01/2018 CLINICAL DATA:  Injury.  Pain EXAM: RIGHT SHOULDER - 2+ VIEW COMPARISON:  None. FINDINGS: There is no evidence of fracture or dislocation. There is no evidence of arthropathy or other focal bone abnormality. Soft tissues are unremarkable. IMPRESSION: Negative. Electronically Signed   By: Marlan Palau M.D.   On: 03/01/2018 15:18    Procedures Procedures (including critical care time)  Medications Ordered in ED Medications  HYDROcodone-acetaminophen (NORCO/VICODIN) 5-325 MG per tablet 1 tablet (1 tablet Oral Given 03/01/18 1613)  cephALEXin (KEFLEX) capsule 250 mg (250 mg Oral Given 03/01/18 1831)     Initial Impression / Assessment and Plan / ED Course  I have reviewed the triage vital signs and the nursing notes.  Pertinent labs & imaging results that were available during my care of the patient were reviewed by me and considered in my medical decision making (see chart for details).  Clinical Course as of Mar 03 1006  Mon Mar 01, 2018  1637 Patient's husband now in the room to and he relates a similar story.  She has a history of falls and seems to have this rapid gait.  He tried to break her fall on Saturday by catching her arm and she is been complaining of that right shoulder pain since then.  Rechecking some screening labs to  see if there is any other etiology for the  patient's imbalance.  I asked her about physical therapy and she said she had gone about 6 months ago when she broke her pelvis.   [MB]    Clinical Course User Index [MB] Terrilee Files, MD   Patient's imaging is negative for fracture.  Her pain still seems very mechanical as every time she moves her arm left side up and she winces in pain.  I reviewed the tests with her that she is positive for urine infection she said she is not surprised and noticed some symptoms.  She is comfortable going home on some antibiotics and she asked if I would put in a consult for her to get physical therapy.  I let her know I will try but she should also connect with her primary care doctor as they can also arrange it.  Final Clinical Impressions(s) / ED Diagnoses   Final diagnoses:  Acute pain of right shoulder  Urinary tract infection in female    ED Discharge Orders        Ordered    cephALEXin (KEFLEX) 250 MG capsule  4 times daily     03/01/18 1831       Terrilee Files, MD 03/03/18 1009

## 2018-03-01 NOTE — ED Notes (Signed)
[facility]

## 2018-03-01 NOTE — ED Triage Notes (Signed)
Pt reports that she has been falling frequently . Reports she went to stand up from a chair and started falling and husband grabbed by  Arm and right arm has been hurting since

## 2018-03-04 LAB — URINE CULTURE
Culture: >=100000 — AB
SPECIAL REQUESTS: NORMAL

## 2018-04-04 ENCOUNTER — Emergency Department (HOSPITAL_COMMUNITY): Payer: Medicare Other

## 2018-04-04 ENCOUNTER — Encounter (HOSPITAL_COMMUNITY): Payer: Self-pay | Admitting: *Deleted

## 2018-04-04 ENCOUNTER — Other Ambulatory Visit: Payer: Self-pay

## 2018-04-04 ENCOUNTER — Emergency Department (HOSPITAL_COMMUNITY)
Admission: EM | Admit: 2018-04-04 | Discharge: 2018-04-04 | Disposition: A | Discharge status: Home / Self Care | Payer: Medicare Other | Attending: Emergency Medicine | Admitting: Emergency Medicine

## 2018-04-04 DIAGNOSIS — T148XXA Other injury of unspecified body region, initial encounter: Secondary | ICD-10-CM | POA: Insufficient documentation

## 2018-04-04 DIAGNOSIS — S0990XA Unspecified injury of head, initial encounter: Secondary | ICD-10-CM | POA: Diagnosis not present

## 2018-04-04 DIAGNOSIS — S32059A Unspecified fracture of fifth lumbar vertebra, initial encounter for closed fracture: Secondary | ICD-10-CM

## 2018-04-04 DIAGNOSIS — S2232XA Fracture of one rib, left side, initial encounter for closed fracture: Secondary | ICD-10-CM | POA: Diagnosis not present

## 2018-04-04 DIAGNOSIS — N390 Urinary tract infection, site not specified: Secondary | ICD-10-CM

## 2018-04-04 DIAGNOSIS — S22049A Unspecified fracture of fourth thoracic vertebra, initial encounter for closed fracture: Secondary | ICD-10-CM | POA: Diagnosis not present

## 2018-04-04 DIAGNOSIS — S40022A Contusion of left upper arm, initial encounter: Secondary | ICD-10-CM

## 2018-04-04 DIAGNOSIS — S32049A Unspecified fracture of fourth lumbar vertebra, initial encounter for closed fracture: Secondary | ICD-10-CM | POA: Diagnosis not present

## 2018-04-04 DIAGNOSIS — Y92009 Unspecified place in unspecified non-institutional (private) residence as the place of occurrence of the external cause: Secondary | ICD-10-CM

## 2018-04-04 DIAGNOSIS — Y998 Other external cause status: Secondary | ICD-10-CM | POA: Diagnosis not present

## 2018-04-04 DIAGNOSIS — Y929 Unspecified place or not applicable: Secondary | ICD-10-CM | POA: Diagnosis not present

## 2018-04-04 DIAGNOSIS — S22029A Unspecified fracture of second thoracic vertebra, initial encounter for closed fracture: Secondary | ICD-10-CM | POA: Diagnosis not present

## 2018-04-04 DIAGNOSIS — S40812A Abrasion of left upper arm, initial encounter: Secondary | ICD-10-CM | POA: Diagnosis not present

## 2018-04-04 DIAGNOSIS — Z23 Encounter for immunization: Secondary | ICD-10-CM | POA: Diagnosis not present

## 2018-04-04 DIAGNOSIS — Z87891 Personal history of nicotine dependence: Secondary | ICD-10-CM | POA: Insufficient documentation

## 2018-04-04 DIAGNOSIS — I10 Essential (primary) hypertension: Secondary | ICD-10-CM | POA: Insufficient documentation

## 2018-04-04 DIAGNOSIS — R1010 Upper abdominal pain, unspecified: Secondary | ICD-10-CM | POA: Diagnosis not present

## 2018-04-04 DIAGNOSIS — Y9301 Activity, walking, marching and hiking: Secondary | ICD-10-CM | POA: Diagnosis not present

## 2018-04-04 DIAGNOSIS — W010XXA Fall on same level from slipping, tripping and stumbling without subsequent striking against object, initial encounter: Secondary | ICD-10-CM | POA: Insufficient documentation

## 2018-04-04 DIAGNOSIS — Z79899 Other long term (current) drug therapy: Secondary | ICD-10-CM | POA: Insufficient documentation

## 2018-04-04 DIAGNOSIS — S299XXA Unspecified injury of thorax, initial encounter: Secondary | ICD-10-CM | POA: Diagnosis present

## 2018-04-04 DIAGNOSIS — W19XXXA Unspecified fall, initial encounter: Secondary | ICD-10-CM

## 2018-04-04 LAB — URINALYSIS, ROUTINE W REFLEX MICROSCOPIC
Bilirubin Urine: NEGATIVE
Glucose, UA: NEGATIVE mg/dL
Ketones, ur: NEGATIVE mg/dL
Nitrite: POSITIVE — AB
PROTEIN: 100 mg/dL — AB
Specific Gravity, Urine: 1.009 (ref 1.005–1.030)
WBC, UA: >50 WBC/hpf — ABNORMAL HIGH (ref 0–5)
pH: 7 (ref 5.0–8.0)

## 2018-04-04 LAB — CBC WITH DIFFERENTIAL/PLATELET
BASOS ABS: 0 10*3/uL (ref 0.0–0.1)
BASOS PCT: 0 %
EOS ABS: 0.2 10*3/uL (ref 0.0–0.7)
Eosinophils Relative: 2 %
HCT: 32.8 % — ABNORMAL LOW (ref 36.0–46.0)
Hemoglobin: 10.3 g/dL — ABNORMAL LOW (ref 12.0–15.0)
Lymphocytes Relative: 20 %
Lymphs Abs: 1.4 10*3/uL (ref 0.7–4.0)
MCH: 27.4 pg (ref 26.0–34.0)
MCHC: 31.4 g/dL (ref 30.0–36.0)
MCV: 87.2 fL (ref 78.0–100.0)
MONO ABS: 0.6 10*3/uL (ref 0.1–1.0)
Monocytes Relative: 8 %
Neutro Abs: 4.8 10*3/uL (ref 1.7–7.7)
Neutrophils Relative %: 70 %
Platelets: 255 10*3/uL (ref 150–400)
RBC: 3.76 MIL/uL — ABNORMAL LOW (ref 3.87–5.11)
RDW: 13.5 % (ref 11.5–15.5)
WBC: 6.9 10*3/uL (ref 4.0–10.5)

## 2018-04-04 LAB — BASIC METABOLIC PANEL
Anion gap: 7 (ref 5–15)
BUN: 9 mg/dL (ref 8–23)
CO2: 27 mmol/L (ref 22–32)
Calcium: 9.1 mg/dL (ref 8.9–10.3)
Chloride: 100 mmol/L (ref 98–111)
Creatinine, Ser: 0.82 mg/dL (ref 0.44–1.00)
GFR calc Af Amer: >60 mL/min (ref >60)
GFR calc non Af Amer: >60 mL/min (ref >60)
GLUCOSE: 112 mg/dL — AB (ref 70–99)
Potassium: 3.6 mmol/L (ref 3.5–5.1)
Sodium: 134 mmol/L — ABNORMAL LOW (ref 135–145)

## 2018-04-04 MED ORDER — CEPHALEXIN 500 MG PO CAPS
500.0000 mg | ORAL_CAPSULE | Freq: Once | ORAL | Status: AC
  Administered 2018-04-04: 500 mg via ORAL
  Filled 2018-04-04: qty 1

## 2018-04-04 MED ORDER — FENTANYL CITRATE (PF) 100 MCG/2ML IJ SOLN
6.2500 ug | INTRAMUSCULAR | Status: AC | PRN
  Administered 2018-04-04 (×2): 6.5 ug via INTRAVENOUS
  Filled 2018-04-04 (×2): qty 2

## 2018-04-04 MED ORDER — IOPAMIDOL (ISOVUE-300) INJECTION 61%
75.0000 mL | Freq: Once | INTRAVENOUS | Status: AC | PRN
  Administered 2018-04-04: 75 mL via INTRAVENOUS

## 2018-04-04 MED ORDER — TETANUS-DIPHTH-ACELL PERTUSSIS 5-2.5-18.5 LF-MCG/0.5 IM SUSP
0.5000 mL | Freq: Once | INTRAMUSCULAR | Status: AC
  Administered 2018-04-04: 0.5 mL via INTRAMUSCULAR
  Filled 2018-04-04: qty 0.5

## 2018-04-04 MED ORDER — HYDROCODONE-ACETAMINOPHEN 5-325 MG PO TABS: ORAL_TABLET | ORAL | 0 refills | Status: AC

## 2018-04-04 MED ORDER — HYDROCODONE-ACETAMINOPHEN 5-325 MG PO TABS
1.0000 | ORAL_TABLET | Freq: Once | ORAL | Status: AC
  Administered 2018-04-04: 1 via ORAL
  Filled 2018-04-04: qty 1

## 2018-04-04 MED ORDER — CEPHALEXIN 500 MG PO CAPS: 500.0000 mg | ORAL_CAPSULE | Freq: Four times a day (QID) | ORAL | 0 refills | Status: DC

## 2018-04-04 NOTE — ED Notes (Signed)
Patient reports pain increasing again 8/10. Ordered medication given.

## 2018-04-04 NOTE — ED Provider Notes (Signed)
[facility] EMERGENCY DEPARTMENT Provider Note   CSN: [id number] Arrival date & time: 04/04/18  1058     History   Chief Complaint Chief Complaint  Patient presents with  . Fall    HPI Crystal Lopez is a 83 y.o. female.  HPI Pt was seen at 1120. Per pt and her family, c/o sudden onset and resolution of one episode of fall that occurred yesterday. Pt states she fell forward while walking. Endorses hx of frequent falls. Pt states her "whole torso hurts from my spine to my front," possibly more on her left side. Pt states her also bruised her LUE during the fall. Denies LOC/syncope, no AMS, no prodromal symptoms before fall. Denies N/V/D, no palpitations, no SOB, no focal motor weakness, no tingling/numbness in extremities.    Past Medical History:  Diagnosis Date  . Anxiety   . Aspirin allergy   . B12 deficiency   . Chest discomfort    Previous negative ischemic workup  . DDD (degenerative disc disease), cervical   . Gait instability   . GERD (gastroesophageal reflux disease)   . Hip fx (HCC)   . Hypertension   . Migraines   . Osteoporosis   . Overactive bladder   . Palpitations   . Restless leg   . Rib pain    March, 2012, injury from a fall  . Sleep apnea    Suspected diagnosis    Patient Active Problem List   Diagnosis Date Noted  . Acute metabolic encephalopathy 12/14/2016  . Frequent falls 12/14/2016  . Lung nodule 12/14/2016  . CAP (community acquired pneumonia) 12/13/2016  . UTI (urinary tract infection) 12/13/2016  . Intractable migraine 03/13/2016  . Sleep apnea   . Ejection fraction   . Aspirin allergy   . Palpitations   . GERD (gastroesophageal reflux disease)   . Chest discomfort   . Shortness of breath     Past Surgical History:  Procedure Laterality Date  . APPENDECTOMY    . EXPLORATORY LAPAROTOMY    . Hysterectomy    . L temple cut       OB History   None      Home Medications    Prior to Admission medications   Medication  Sig Start Date End Date Taking? Authorizing Provider  amitriptyline (ELAVIL) 10 MG tablet Take 1 tablet (10 mg total) by mouth at bedtime. Patient taking differently: Take 20 mg by mouth at bedtime.  03/17/16  Yes Anson Fret, MD  benzonatate (TESSALON) 100 MG capsule Take 100 mg by mouth daily as needed for cough.    Yes [provider]  clonazePAM (KLONOPIN) 2 MG tablet Take 1-2 mg by mouth 2 (two) times daily. Prescribed 1mg  twice daily and 2mg  at bedtime   Yes [provider]  cyanocobalamin (,VITAMIN B-12,) 1000 MCG/ML injection Inject 1,000 mcg into the muscle once.   Yes [provider]  Dextromethorphan-guaiFENesin (CHILDRENS COUGH) 5-100 MG/5ML LIQD Take 5 mLs by mouth daily as needed (cough).   Yes [provider]  fluticasone (FLONASE) 50 MCG/ACT nasal spray Place 1 spray into both nostrils daily as needed for allergies or rhinitis.   Yes [provider]  gabapentin (NEURONTIN) 300 MG capsule Take 4 capsules (1,200 mg total) by mouth 3 (three) times daily. Patient taking differently: Take 300 mg by mouth at bedtime.  03/13/16  Yes Anson Fret, MD  HYDROcodone-acetaminophen (NORCO/VICODIN) 5-325 MG tablet Take 1 tablet by mouth every 6 (six) hours  as needed for moderate pain. Patient taking differently: Take 0.5-1 tablets by mouth daily.  12/16/16  Yes Philip Aspen, Limmie Patricia, MD  magnesium oxide (MAG-OX) 400 MG tablet Take 400 mg by mouth daily.   Yes [provider]  methenamine (HIPREX) 1 g tablet Take 1 g by mouth 2 (two) times daily with a meal.   Yes [provider]  metoprolol tartrate (LOPRESSOR) 50 MG tablet Take 50 mg by mouth daily.   Yes [provider]  nitroGLYCERIN (NITROSTAT) 0.3 MG SL tablet Place 1 tablet (0.3 mg total) under the tongue every 5 (five) minutes as needed for chest pain. 02/10/14  Yes Luis Abed, MD  ondansetron (ZOFRAN ODT) 4 MG disintegrating tablet Take 1 tablet (4 mg  total) by mouth every 8 (eight) hours as needed for nausea or vomiting. 12/16/16  Yes Philip Aspen, Limmie Patricia, MD  pantoprazole (PROTONIX) 40 MG tablet Take 40 mg by mouth daily.    Yes [provider]  ranitidine (ZANTAC) 150 MG tablet Take 300 mg by mouth daily as needed for heartburn.    Yes [provider]  cephALEXin (KEFLEX) 250 MG capsule Take 1 capsule (250 mg total) by mouth 4 (four) times daily. Patient not taking: Reported on 04/04/2018 03/01/18   Terrilee Files, MD    Family History Family History  Problem Relation Age of Onset  . Heart failure Mother   . Heart disease Father   . Heart failure Father   . Heart disease Sister   . Heart attack Sister   . Heart disease Brother   . Heart attack Brother   . Migraines Neg Hx     Social History Social History   Tobacco Use  . Smoking status: Former Smoker    Packs/day: 2.00    Years: 7.00    Pack years: 14.00    Types: Cigarettes    Last attempt to quit: 09/08/1964    Years since quitting: 53.6  . Smokeless tobacco: Never Used  Substance Use Topics  . Alcohol use: No    Alcohol/week: 0.0 oz  . Drug use: No     Allergies   Codeine; Darvocet [propoxyphene n-acetaminophen]; Dilaudid [hydromorphone hcl]; Morphine and related; Boniva [ibandronate sodium]; Depakote er [divalproex sodium er]; Doxycycline; Levsin [hyoscyamine sulfate]; Macrodantin; Prilosec [omeprazole]; Ropinirole hcl; Sulfa antibiotics; Tegretol [carbamazepine]; Topamax [topiramate]; Aspirin; and Erythromycin   Review of Systems Review of Systems ROS: Statement: All systems negative except as marked or noted in the HPI; Constitutional: Negative for fever and chills. ; ; Eyes: Negative for eye pain, redness and discharge. ; ; ENMT: Negative for ear pain, hoarseness, nasal congestion, sinus pressure and sore throat. ; ; Cardiovascular: Negative for chest pain, palpitations, diaphoresis, dyspnea and peripheral edema. ; ; Respiratory:  Negative for cough, wheezing and stridor. ; ; Gastrointestinal: Negative for nausea, vomiting, diarrhea, abdominal pain, blood in stool, hematemesis, jaundice and rectal bleeding. . ; ; Genitourinary: Negative for dysuria, flank pain and hematuria. ; ; Musculoskeletal: +"whole torso hurts."  Negative for swelling and deformity.; ; Skin: +abrasion, bruising. Negative for pruritus, rash, blisters, and skin lesion.; ; Neuro: Negative for headache, lightheadedness and neck stiffness. Negative for weakness, altered level of consciousness, altered mental status, extremity weakness, paresthesias, involuntary movement, seizure and syncope.       Physical Exam Updated Vital Signs BP 118/61 (BP Location: Right Arm)   Pulse 79   Temp 98 F (36.7 C) (Oral)   Resp 16   Ht 5'  1" (1.549 m)   Wt 47.6 kg (105 lb)   SpO2 98%   BMI 19.84 kg/m   Physical Exam 1125: Physical examination: Vital signs and O2 SAT: Reviewed; Constitutional: Well developed, Well nourished, Well hydrated, In no acute distress; Head and Face: Normocephalic, Atraumatic; Eyes: EOMI, PERRL, No scleral icterus; ENMT: Mouth and pharynx normal, Mucous membranes moist; Neck: Immobilized in C-collar, Trachea midline; Spine: No midline CS, TS, LS tenderness. +TTP left thoracic and lumbar paraspinal muscles. No abrasions, no ecchymosis..; Cardiovascular: Regular rate and rhythm, No gallop; Respiratory: Breath sounds clear & equal bilaterally, No wheezes, Normal respiratory effort/excursion; Chest: Nontender, No deformity, Movement normal, No crepitus, No abrasions or ecchymosis.; Abdomen: Soft, +left sided > diffuse tenderness to palp. Nondistended, Normal bowel sounds, No abrasions or ecchymosis.; Genitourinary: No CVA tenderness;; Extremities: +superficial abrasion with ecchymosis to left mid-triceps area. +ecchymosis dorsal forearm. No erythema. Muscles compartments soft. NT left shoulder/elbow/wrist/hand. Full range of motion major/large joints  of bilat UE's and LE's without pain or tenderness to palp, Neurovascularly intact, Pulses normal, No deformity. No tenderness, No edema, Pelvis stable; Neuro: AA&Ox3, GCS 15.  Major CN grossly intact. Speech clear. No gross focal motor or sensory deficits in extremities.; Skin: Color normal, Warm, Dry    ED Treatments / Results  Labs (all labs ordered are listed, but only abnormal results are displayed)   EKG None  Radiology   Procedures Procedures (including critical care time)  Medications Ordered in ED Medications  fentaNYL (SUBLIMAZE) injection 6.5 mcg (6.5 mcg Intravenous Given 04/04/18 1206)  Tdap (BOOSTRIX) injection 0.5 mL (0.5 mLs Intramuscular Given 04/04/18 1212)  iopamidol (ISOVUE-300) 61 % injection 75 mL (75 mLs Intravenous Contrast Given 04/04/18 1243)     Initial Impression / Assessment and Plan / ED Course  I have reviewed the triage vital signs and the nursing notes.  Pertinent labs & imaging results that were available during my care of the patient were reviewed by me and considered in my medical decision making (see chart for details).  MDM Reviewed: previous chart, nursing note and vitals Reviewed previous: labs Interpretation: labs and CT scan    Results for orders placed or performed during the hospital encounter of 04/04/18  CBC with Differential  Result Value Ref Range   WBC 6.9 4.0 - 10.5 K/uL   RBC 3.76 (L) 3.87 - 5.11 MIL/uL   Hemoglobin 10.3 (L) 12.0 - 15.0 g/dL   HCT 19.132.8 (L) 47.836.0 - 29.546.0 %   MCV 87.2 78.0 - 100.0 fL   MCH 27.4 26.0 - 34.0 pg   MCHC 31.4 30.0 - 36.0 g/dL   RDW 62.113.5 30.811.5 - 65.715.5 %   Platelets 255 150 - 400 K/uL   Neutrophils Relative % 70 %   Neutro Abs 4.8 1.7 - 7.7 K/uL   Lymphocytes Relative 20 %   Lymphs Abs 1.4 0.7 - 4.0 K/uL   Monocytes Relative 8 %   Monocytes Absolute 0.6 0.1 - 1.0 K/uL   Eosinophils Relative 2 %   Eosinophils Absolute 0.2 0.0 - 0.7 K/uL   Basophils Relative 0 %   Basophils Absolute 0.0 0.0 -  0.1 K/uL  Basic metabolic panel  Result Value Ref Range   Sodium 134 (L) 135 - 145 mmol/L   Potassium 3.6 3.5 - 5.1 mmol/L   Chloride 100 98 - 111 mmol/L   CO2 27 22 - 32 mmol/L   Glucose, Bld 112 (H) 70 - 99 mg/dL   BUN 9 8 - 23 mg/dL  Creatinine, Ser 0.82 0.44 - 1.00 mg/dL   Calcium 9.1 8.9 - 65.7 mg/dL   GFR calc non Af Amer >60 >60 mL/min   GFR calc Af Amer >60 >60 mL/min   Anion gap 7 5 - 15  Urinalysis, Routine w reflex microscopic  Result Value Ref Range   Color, Urine YELLOW (A) YELLOW   APPearance TURBID (A) CLEAR   Specific Gravity, Urine 1.009 1.005 - 1.030   pH 7.0 5.0 - 8.0   Glucose, UA NEGATIVE NEGATIVE mg/dL   Hgb urine dipstick MODERATE (A) NEGATIVE   Bilirubin Urine NEGATIVE NEGATIVE   Ketones, ur NEGATIVE NEGATIVE mg/dL   Protein, ur 846 (A) NEGATIVE mg/dL   Nitrite POSITIVE (A) NEGATIVE   Leukocytes, UA MODERATE (A) NEGATIVE   RBC / HPF 6-10 0 - 5 RBC/hpf   WBC, UA >50 (H) 0 - 5 WBC/hpf   Bacteria, UA MANY (A) NONE SEEN   Squamous Epithelial / LPF 0-5 0 - 5   WBC Clumps PRESENT    Mucus PRESENT     Ct Head Wo Contrast Result Date: 04/04/2018 CLINICAL DATA:  83 year old female with acute head and neck injury following fall yesterday. EXAM: CT HEAD WITHOUT CONTRAST CT CERVICAL SPINE WITHOUT CONTRAST TECHNIQUE: Multidetector CT imaging of the head and cervical spine was performed following the standard protocol without intravenous contrast. Multiplanar CT image reconstructions of the cervical spine were also generated. COMPARISON:  12/13/2016 CTs FINDINGS: CT HEAD FINDINGS Brain: No evidence of acute infarction, hemorrhage, hydrocephalus, extra-axial collection or mass lesion/mass effect. Atrophy and chronic small-vessel white matter ischemic changes again noted. Vascular: Carotid atherosclerotic calcifications again noted. Skull: Normal. Negative for fracture or focal lesion. Sinuses/Orbits: No acute finding. Other: None. CT CERVICAL SPINE FINDINGS Alignment:  Normal. Skull base and vertebrae: No acute fracture or suspicious focal bony lesions. 20% compression fractures of the T2 and T4 SUPERIOR endplates are new since 12/27/2016 but appear subacute to remote. Soft tissues and spinal canal: No prevertebral fluid or swelling. No visible canal hematoma. Disc levels: Mild multilevel degenerative disc disease/spondylosis and facet arthropathy again noted. Upper chest: No acute abnormality. Other: None IMPRESSION: 1. No evidence of acute intracranial abnormality. Atrophy and chronic small-vessel white matter ischemic changes 2. No static evidence of acute injury to the cervical spine. 3. T12 in T4 SUPERIOR endplate compression fractures, new since 12/27/2016 but appear subacute to remote. Correlate clinically. Electronically Signed   By: Harmon Pier M.D.   On: 04/04/2018 13:41   Ct Chest W Contrast Result Date: 04/04/2018 CLINICAL DATA:  Fall yesterday. Left-sided and upper abdominal pain. EXAM: CT CHEST, ABDOMEN, AND PELVIS WITH CONTRAST TECHNIQUE: Multidetector CT imaging of the chest, abdomen and pelvis was performed following the standard protocol during bolus administration of intravenous contrast. CONTRAST:  75mL ISOVUE-300 IOPAMIDOL (ISOVUE-300) INJECTION 61% COMPARISON:  12/13/2016 chest CT. FINDINGS: CT CHEST FINDINGS Cardiovascular: Top-normal heart size. No significant pericardial fluid/thickening. Three-vessel coronary atherosclerosis. Atherosclerotic nonaneurysmal thoracic aorta. Normal caliber pulmonary arteries. No evidence of acute thoracic aortic injury. No central pulmonary emboli. Mediastinum/Nodes: No pneumomediastinum. No mediastinal hematoma. No discrete thyroid nodules. Patulous esophagus with esophageal air-fluid level. No axillary, mediastinal or hilar lymphadenopathy. Lungs/Pleura: No pneumothorax. No pleural effusion. Mildly thickened parenchymal band in the basilar right upper lobe, decreased from prior chest CT, compatible with  postinfectious/postinflammatory scarring. Mild patchy ground-glass centrilobular and tree-in-bud opacities in basilar right upper lobe. No acute consolidative airspace disease or lung masses. Two small solid pulmonary nodules in the left lower lobe,  largest 4 mm (series 4/image 46) are both stable since 12/13/2016 chest CT and considered benign. No new significant pulmonary nodules. Tiny calcified granulomas scattered in the right lung. No pneumatoceles. Musculoskeletal: No aggressive appearing focal osseous lesions. Mild-to-moderate superior T2 and T4 vertebral compression fractures are new since 12/13/2016 CT. Stable chronic moderate T6 and T7 and mild T9 and T11 vertebral compression fractures. Mild thoracic spondylosis. Mild healed deformities in the posterolateral lower right ribs. Suggestion of a subtle nondisplaced lateral left eighth rib fracture. CT ABDOMEN PELVIS FINDINGS Hepatobiliary: Normal liver size. Simple 2.0 cm right liver lobe cyst. Additional subcentimeter hypodense lesions scattered in the liver are too small to characterize and require no follow-up unless the patient has risk factors for liver malignancy. No liver laceration. Normal gallbladder with no radiopaque cholelithiasis. No biliary ductal dilatation. Pancreas: Normal, with no laceration, mass or duct dilation. Spleen: Normal size. No laceration or mass. Adrenals/Urinary Tract: Normal adrenals. No hydronephrosis. No renal laceration. No renal mass. Moderately distended and otherwise normal bladder. Stomach/Bowel: Small to moderate hiatal hernia. Otherwise normal nondistended stomach. Normal caliber small bowel with no small bowel wall thickening. Appendectomy. Moderate sigmoid diverticulosis. No large bowel wall thickening or significant pericolonic fat stranding. Vascular/Lymphatic: Atherosclerotic nonaneurysmal abdominal aorta with no evidence of acute abdominal aortic injury. Patent portal, splenic, hepatic and renal veins.  Retroaortic left renal vein. No pathologically enlarged lymph nodes in the abdomen or pelvis. Reproductive: Status post hysterectomy, with no abnormal findings at the vaginal cuff. No adnexal mass. Other: No pneumoperitoneum, ascites or focal fluid collection. Musculoskeletal: No aggressive appearing focal osseous lesions. Status post L1 vertebroplasty. Moderate L4 vertebral compression fracture as slightly worsened since 12/13/2016 CT. Stable chronic moderate L5 vertebral compression fracture. No acute abdominopelvic fracture. Healed deformities in the bilateral pubic rami. Diffuse osteopenia. No pelvic diastasis. IMPRESSION: 1. Mild-to-moderate T2 and T4 vertebral compression fractures, new since 12/13/2016 CT. Additional chronic stable multilevel thoracic vertebral compression fractures. 2. Suggestion of a subtle nondisplaced lateral left eighth rib fracture. 3. Moderate chronic L4 vertebral compression fracture has worsened since 12/13/2016 CT. Stable chronic moderate L5 vertebral compression fracture. 4. No additional acute traumatic injuries. 5. Mild patchy ground-glass centrilobular and tree-in-bud opacities in the right upper lobe, probably inflammatory. 6. Chronic findings include: Aortic Atherosclerosis (ICD10-I70.0). Three-vessel coronary atherosclerosis. Small to moderate hiatal hernia. Moderate sigmoid diverticulosis. Electronically Signed   By: Delbert Phenix M.D.   On: 04/04/2018 14:07   Ct Cervical Spine Wo Contrast Result Date: 04/04/2018 CLINICAL DATA:  83 year old female with acute head and neck injury following fall yesterday. EXAM: CT HEAD WITHOUT CONTRAST CT CERVICAL SPINE WITHOUT CONTRAST TECHNIQUE: Multidetector CT imaging of the head and cervical spine was performed following the standard protocol without intravenous contrast. Multiplanar CT image reconstructions of the cervical spine were also generated. COMPARISON:  12/13/2016 CTs FINDINGS: CT HEAD FINDINGS Brain: No evidence of acute  infarction, hemorrhage, hydrocephalus, extra-axial collection or mass lesion/mass effect. Atrophy and chronic small-vessel white matter ischemic changes again noted. Vascular: Carotid atherosclerotic calcifications again noted. Skull: Normal. Negative for fracture or focal lesion. Sinuses/Orbits: No acute finding. Other: None. CT CERVICAL SPINE FINDINGS Alignment: Normal. Skull base and vertebrae: No acute fracture or suspicious focal bony lesions. 20% compression fractures of the T2 and T4 SUPERIOR endplates are new since 12/27/2016 but appear subacute to remote. Soft tissues and spinal canal: No prevertebral fluid or swelling. No visible canal hematoma. Disc levels: Mild multilevel degenerative disc disease/spondylosis and facet arthropathy again noted. Upper chest: No acute  abnormality. Other: None IMPRESSION: 1. No evidence of acute intracranial abnormality. Atrophy and chronic small-vessel white matter ischemic changes 2. No static evidence of acute injury to the cervical spine. 3. T12 in T4 SUPERIOR endplate compression fractures, new since 12/27/2016 but appear subacute to remote. Correlate clinically. Electronically Signed   By: Harmon Pier M.D.   On: 04/04/2018 13:41   Ct Abdomen Pelvis W Contrast Result Date: 04/04/2018 CLINICAL DATA:  Fall yesterday. Left-sided and upper abdominal pain. EXAM: CT CHEST, ABDOMEN, AND PELVIS WITH CONTRAST TECHNIQUE: Multidetector CT imaging of the chest, abdomen and pelvis was performed following the standard protocol during bolus administration of intravenous contrast. CONTRAST:  75mL ISOVUE-300 IOPAMIDOL (ISOVUE-300) INJECTION 61% COMPARISON:  12/13/2016 chest CT. FINDINGS: CT CHEST FINDINGS Cardiovascular: Top-normal heart size. No significant pericardial fluid/thickening. Three-vessel coronary atherosclerosis. Atherosclerotic nonaneurysmal thoracic aorta. Normal caliber pulmonary arteries. No evidence of acute thoracic aortic injury. No central pulmonary emboli.  Mediastinum/Nodes: No pneumomediastinum. No mediastinal hematoma. No discrete thyroid nodules. Patulous esophagus with esophageal air-fluid level. No axillary, mediastinal or hilar lymphadenopathy. Lungs/Pleura: No pneumothorax. No pleural effusion. Mildly thickened parenchymal band in the basilar right upper lobe, decreased from prior chest CT, compatible with postinfectious/postinflammatory scarring. Mild patchy ground-glass centrilobular and tree-in-bud opacities in basilar right upper lobe. No acute consolidative airspace disease or lung masses. Two small solid pulmonary nodules in the left lower lobe, largest 4 mm (series 4/image 46) are both stable since 12/13/2016 chest CT and considered benign. No new significant pulmonary nodules. Tiny calcified granulomas scattered in the right lung. No pneumatoceles. Musculoskeletal: No aggressive appearing focal osseous lesions. Mild-to-moderate superior T2 and T4 vertebral compression fractures are new since 12/13/2016 CT. Stable chronic moderate T6 and T7 and mild T9 and T11 vertebral compression fractures. Mild thoracic spondylosis. Mild healed deformities in the posterolateral lower right ribs. Suggestion of a subtle nondisplaced lateral left eighth rib fracture. CT ABDOMEN PELVIS FINDINGS Hepatobiliary: Normal liver size. Simple 2.0 cm right liver lobe cyst. Additional subcentimeter hypodense lesions scattered in the liver are too small to characterize and require no follow-up unless the patient has risk factors for liver malignancy. No liver laceration. Normal gallbladder with no radiopaque cholelithiasis. No biliary ductal dilatation. Pancreas: Normal, with no laceration, mass or duct dilation. Spleen: Normal size. No laceration or mass. Adrenals/Urinary Tract: Normal adrenals. No hydronephrosis. No renal laceration. No renal mass. Moderately distended and otherwise normal bladder. Stomach/Bowel: Small to moderate hiatal hernia. Otherwise normal nondistended  stomach. Normal caliber small bowel with no small bowel wall thickening. Appendectomy. Moderate sigmoid diverticulosis. No large bowel wall thickening or significant pericolonic fat stranding. Vascular/Lymphatic: Atherosclerotic nonaneurysmal abdominal aorta with no evidence of acute abdominal aortic injury. Patent portal, splenic, hepatic and renal veins. Retroaortic left renal vein. No pathologically enlarged lymph nodes in the abdomen or pelvis. Reproductive: Status post hysterectomy, with no abnormal findings at the vaginal cuff. No adnexal mass. Other: No pneumoperitoneum, ascites or focal fluid collection. Musculoskeletal: No aggressive appearing focal osseous lesions. Status post L1 vertebroplasty. Moderate L4 vertebral compression fracture as slightly worsened since 12/13/2016 CT. Stable chronic moderate L5 vertebral compression fracture. No acute abdominopelvic fracture. Healed deformities in the bilateral pubic rami. Diffuse osteopenia. No pelvic diastasis. IMPRESSION: 1. Mild-to-moderate T2 and T4 vertebral compression fractures, new since 12/13/2016 CT. Additional chronic stable multilevel thoracic vertebral compression fractures. 2. Suggestion of a subtle nondisplaced lateral left eighth rib fracture. 3. Moderate chronic L4 vertebral compression fracture has worsened since 12/13/2016 CT. Stable chronic moderate L5 vertebral compression fracture.  4. No additional acute traumatic injuries. 5. Mild patchy ground-glass centrilobular and tree-in-bud opacities in the right upper lobe, probably inflammatory. 6. Chronic findings include: Aortic Atherosclerosis (ICD10-I70.0). Three-vessel coronary atherosclerosis. Small to moderate hiatal hernia. Moderate sigmoid diverticulosis. Electronically Signed   By: Delbert Phenix M.D.   On: 04/04/2018 14:07    1450:  Pt has ambulated around the ED with Sats remaining 100% R/A, resps easy, gait steady, NAD.  CT with multiple old vertebral fx and subtle left 8th rib fx.  +UTI, UC pending. Tx symptomatically at this time. Dx and testing d/w pt and family.  Questions answered.  Verb understanding, agreeable to d/c home with outpt f/u.   Final Clinical Impressions(s) / ED Diagnoses   Final diagnoses:  None    ED Discharge Orders    None       Samuel Jester, DO 04/07/18 2109

## 2018-04-04 NOTE — ED Triage Notes (Signed)
Pt fell yesterday, now c/o left side and upper abd. , denies hitting her head and denies taking blood thinners.

## 2018-04-04 NOTE — Discharge Instructions (Addendum)
Take the prescriptions as directed.  Wash the abraded area gently with soap and water, and pat dry, at least twice a day, and cover with a clean/dry dressing.  Change the dressing whenever it becomes wet or soiled after washing the area with soap and water and patting dry. Use your incentive spirometer: hold a small/firm pillow against your chest wall (to help splint the area) and perform 3 deep inhalations every 1 hour while you are awake for the next 2 to 3 weeks. Apply moist heat or ice to the area(s) of discomfort, for 15 minutes at a time, several times per day for the next few days. Do not fall asleep on a heating or ice pack.  You have multiple old vertebral compression fractures also. Call your regular medical doctor on Monday to schedule a follow up appointment in the next 3 days. Call your Orthopedic doctor on Monday to schedule a follow up appointment within the next week.  Return to the Emergency Department immediately if worsening.

## 2018-04-04 NOTE — ED Notes (Signed)
Abrasion to left upper arm dressed with telfa, 2x2 gauze and Tegaderm.

## 2018-04-07 LAB — URINE CULTURE: Culture: >=100000 — AB

## 2018-04-08 ENCOUNTER — Telehealth: Payer: Self-pay | Admitting: *Deleted

## 2018-04-08 NOTE — Telephone Encounter (Signed)
Post ED Visit - Positive Culture Follow-up: Successful Patient Follow-Up  Culture assessed and recommendations reviewed by:  []  Enzo BiNathan Batchelder, Pharm.D. []  Celedonio MiyamotoJeremy Frens, Pharm.D., BCPS AQ-ID [id number][]  Georgina PillionElizabeth Martin, [address]Ph[address]D., BCPS []  EstillMinh Pham, [address]Ph[address]D., BCPS, AAHIVP []  Estella HuskMichelle Turner, Pharm.D., BCPS, AAHIVP []  Crystal Pearlachel Rumbarger, PharmD, BCPS []  Phillips Climeshuy Dang, PharmD, BCPS []  Agapito GamesAlison Masters, PharmD, BCPS [x]  Verlan FriendsErin Deja, PharmD  Positive urine culture  []  Patient discharged without antimicrobial prescription and treatment is now indicated []  Organism is resistant to prescribed ED discharge antimicrobial []  Patient with positive blood cultures  Changes discussed with ED provider Army MeliaLaura Murphy who recommends stop Cepalexin.  Likely represents asymptomatic bacteriuria.  Spoke with patient who will follow up with PCP at 04/15/2018 visit       Crystal PearlRobertson, Crystal Lopez 04/08/2018, 11:28 AM

## 2018-04-08 NOTE — Progress Notes (Signed)
ED Antimicrobial Stewardship Positive Culture Follow Up   Crystal StackBetty J Lopez is an 83 y.o. female who [facility] with a chief complaint of s/p fall.  Chief Complaint  Patient presents with  . Fall   Recent Results (from the past 720 hour(s))  Urine culture     Status: Abnormal   Collection Time: 04/04/18 12:30 PM  Result Value Ref Range Status   Specimen Description   Final    URINE, CLEAN CATCH Performed at [facility], [address] [place], [place] [address]    Special Requests   Final    NONE Performed at [facility], [address] [place], [place] [address]    Culture >=100,000 COLONIES/mL ENTEROBACTER SPECIES (A)  Final   Report Status 04/07/2018 FINAL  Final   Organism ID, Bacteria ENTEROBACTER SPECIES (A)  Final      Susceptibility   Enterobacter species - MIC*    CEFAZOLIN >=64 RESISTANT Resistant     CEFTRIAXONE <=1 SENSITIVE Sensitive     CIPROFLOXACIN <=0.25 SENSITIVE Sensitive     GENTAMICIN <=1 SENSITIVE Sensitive     IMIPENEM 2 SENSITIVE Sensitive     NITROFURANTOIN 32 SENSITIVE Sensitive     TRIMETH/SULFA <=20 SENSITIVE Sensitive     PIP/TAZO 8 SENSITIVE Sensitive     * >=100,000 COLONIES/mL ENTEROBACTER SPECIES    [x]  Treated with cephalexin, organism resistant to prescribed antimicrobial []  Patient discharged originally without antimicrobial agent and treatment is now indicated  Stop cephalexin. No urinary symptoms reported, therefore likely representative of asymptomatic bacteruria and does not require treatment. PA concerned for abnormal presentation due to patient age - recommend follow up with PCP for symptom re-check and repeat culture.  ED Provider: Army MeliaLaura Murphy, PA-C  Roderic ScarceErin N. Zigmund Danieleja, PharmD PGY2 Infectious Diseases Pharmacy Resident Phone: [phone] 04/08/2018, 10:04 AM

## 2018-04-26 ENCOUNTER — Encounter (INDEPENDENT_AMBULATORY_CARE_PROVIDER_SITE_OTHER): Payer: Self-pay

## 2018-04-26 ENCOUNTER — Encounter: Payer: Self-pay | Admitting: Cardiology

## 2018-04-26 ENCOUNTER — Ambulatory Visit (INDEPENDENT_AMBULATORY_CARE_PROVIDER_SITE_OTHER): Payer: Medicare Other | Admitting: Cardiology

## 2018-04-26 VITALS — BP 130/78 | HR 74 | Ht <= 58 in | Wt 101.0 lb

## 2018-04-26 DIAGNOSIS — I10 Essential (primary) hypertension: Secondary | ICD-10-CM

## 2018-04-26 DIAGNOSIS — R002 Palpitations: Secondary | ICD-10-CM | POA: Diagnosis not present

## 2018-04-26 DIAGNOSIS — R079 Chest pain, unspecified: Secondary | ICD-10-CM | POA: Diagnosis not present

## 2018-04-26 MED ORDER — METOPROLOL TARTRATE 25 MG PO TABS: 25.0000 mg | ORAL_TABLET | Freq: Two times a day (BID) | ORAL | 0 refills | Status: DC

## 2018-04-26 MED ORDER — NITROGLYCERIN 0.3 MG SL SUBL: 0.3000 mg | SUBLINGUAL_TABLET | SUBLINGUAL | 3 refills | Status: AC | PRN

## 2018-04-26 MED ORDER — ISOSORBIDE MONONITRATE ER 30 MG PO TB24: 15.0000 mg | ORAL_TABLET | Freq: Every day | ORAL | 0 refills | Status: DC

## 2018-04-26 NOTE — Patient Instructions (Signed)
Medication Instructions:  Your physician has recommended you make the following change in your medication:    START Imdur 15 mg daily   DECREASE Lopressor 25 mg twice daily   Please continue all other medications as prescribed   Labwork: NONE  Testing/Procedures: NONE  Follow-Up: Your physician recommends that you schedule a follow-up appointment in: 3 MONTHS WITH DR. MCDOWELL  Any Other Special Instructions Will Be Listed Below (If Applicable).  If you need a refill on your cardiac medications before your next appointment, please call your pharmacy.

## 2018-04-26 NOTE — Progress Notes (Signed)
Cardiology Office Note  Date: 04/26/2018   ID: MERRITT KIBBY, DOB [birth year], MRN [id number]  PCP: Bedelia Person, MD  Primary Cardiologist: Nona Dell, MD   Chief Complaint  Patient presents with  . Cardiac follow-up    History of Present Illness: Crystal Lopez is an 83 y.o. female last seen in May.  She presents early for follow-up with her husband. Records indicate ER evaluation in late July after a fall.  No evidence of acute CNS event by head CT.  Lab work is reviewed below.  She was incidentally diagnosed with a UTI.  Tells me that she has trouble with her balance, uses a walker or a cane, still feels like she is "going backwards" sometimes.  She wanted to come in to be seen reporting episodes of chest pain in the evenings that wake her up.  She uses nitroglycerin, typically with benefit.  She has not had exertional symptoms during the daytime however.  I reviewed her medications which are outlined below.  She is using her Lopressor at 25 mg twice daily, states that this is been helping her in terms of palpitations.  She has several allergies to medications including an aspirin allergy.  I reviewed her recent ECG and lab work.  Past Medical History:  Diagnosis Date  . Anxiety   . Aspirin allergy   . B12 deficiency   . Chest discomfort    Previous negative ischemic workup  . DDD (degenerative disc disease), cervical   . Gait instability   . GERD (gastroesophageal reflux disease)   . Hip fx (HCC)   . Hypertension   . Migraines   . Osteoporosis   . Overactive bladder   . Palpitations   . Restless leg   . Rib pain    March, 2012, injury from a fall  . Sleep apnea    Suspected diagnosis    Past Surgical History:  Procedure Laterality Date  . APPENDECTOMY    . EXPLORATORY LAPAROTOMY    . Hysterectomy    . L temple cut      Current Outpatient Medications  Medication Sig Dispense Refill  . amitriptyline (ELAVIL) 10 MG tablet Take 1 tablet (10 mg total) by  mouth at bedtime. (Patient taking differently: Take 20 mg by mouth at bedtime. ) 30 tablet 3  . benzonatate (TESSALON) 100 MG capsule Take 100 mg by mouth daily as needed for cough.     . clonazePAM (KLONOPIN) 2 MG tablet Take 1-2 mg by mouth 2 (two) times daily. Prescribed 1mg  twice daily and 2mg  at bedtime    . cyanocobalamin (,VITAMIN B-12,) 1000 MCG/ML injection Inject 1,000 mcg into the muscle once.    Marland Kitchen Dextromethorphan-guaiFENesin (CHILDRENS COUGH) 5-100 MG/5ML LIQD Take 5 mLs by mouth daily as needed (cough).    . fluticasone (FLONASE) 50 MCG/ACT nasal spray Place 1 spray into both nostrils daily as needed for allergies or rhinitis.    Marland Kitchen gabapentin (NEURONTIN) 300 MG capsule Take 4 capsules (1,200 mg total) by mouth 3 (three) times daily. (Patient taking differently: Take 300 mg by mouth at bedtime. ) 270 capsule 12  . HYDROcodone-acetaminophen (NORCO/VICODIN) 5-325 MG tablet 1 tab PO q8 hours prn pain 12 tablet 0  . magnesium oxide (MAG-OX) 400 MG tablet Take 400 mg by mouth daily.    . methenamine (HIPREX) 1 g tablet Take 1 g by mouth 2 (two) times daily with a meal.    . nitroGLYCERIN (NITROSTAT) 0.3 MG SL tablet  Place 1 tablet (0.3 mg total) under the tongue every 5 (five) minutes as needed for chest pain. 25 tablet 3  . ondansetron (ZOFRAN ODT) 4 MG disintegrating tablet Take 1 tablet (4 mg total) by mouth every 8 (eight) hours as needed for nausea or vomiting. 20 tablet 0  . pantoprazole (PROTONIX) 40 MG tablet Take 40 mg by mouth daily.     . ranitidine (ZANTAC) 150 MG tablet Take 300 mg by mouth daily as needed for heartburn.     . isosorbide mononitrate (IMDUR) 30 MG 24 hr tablet Take 0.5 tablets (15 mg total) by mouth daily. 45 tablet 0  . metoprolol tartrate (LOPRESSOR) 25 MG tablet Take 1 tablet (25 mg total) by mouth 2 (two) times daily. 180 tablet 0   No current facility-administered medications for this visit.    Allergies:  Codeine; Darvocet [propoxyphene n-acetaminophen];  Dilaudid [hydromorphone hcl]; Morphine and related; Boniva [ibandronate sodium]; Depakote er [divalproex sodium er]; Doxycycline; Levsin [hyoscyamine sulfate]; Macrodantin; Prilosec [omeprazole]; Ropinirole hcl; Sulfa antibiotics; Tegretol [carbamazepine]; Topamax [topiramate]; Aspirin; and Erythromycin   Social History: The patient  reports that she quit smoking about 53 years ago. Her smoking use included cigarettes. She has a 14.00 pack-year smoking history. She has never used smokeless tobacco. She reports that she does not drink alcohol or use drugs.   ROS:  Please see the history of present illness. Otherwise, complete review of systems is positive for anxiety, chronic pain.  All other systems are reviewed and negative.   Physical Exam: VS:  BP 130/78   Pulse 74   Ht 4' 8.5" (1.435 m)   Wt 101 lb (45.8 kg)   SpO2 97%   BMI 22.24 kg/m , BMI Body mass index is 22.24 kg/m.  Wt Readings from Last 3 Encounters:  04/26/18 101 lb (45.8 kg)  04/04/18 105 lb (47.6 kg)  03/01/18 103 lb (46.7 kg)    General: Frail elderly woman, no distress.  HEENT: Conjunctiva and lids normal, oropharynx clear. Neck: Supple, no elevated JVP or carotid bruits, no thyromegaly. Lungs: Clear to auscultation, nonlabored breathing at rest. Cardiac: Regular rate and rhythm, no S3 or significant systolic murmur. Abdomen: Soft, nontender, bowel sounds present. Extremities: No pitting edema, distal pulses 2+. Skin: Warm and dry. Musculoskeletal: Kyphosis noted. Neuropsychiatric: Alert and oriented x3, affect grossly appropriate.  ECG: I personally reviewed the tracing from 03/01/2018 which showed sinus rhythm.  Recent Labwork: 04/04/2018: BUN 9; Creatinine, Ser 0.82; Hemoglobin 10.3; Platelets 255; Potassium 3.6; Sodium 134   Other Studies Reviewed Today:  Echocardiogram 12/11/2016: Study Conclusions  - Left ventricle: The cavity size was normal. Systolic function was   normal. The estimated ejection  fraction was in the range of 60%   to 65%. Wall motion was normal; there were no regional wall   motion abnormalities. Doppler parameters are consistent with   abnormal left ventricular relaxation (grade 1 diastolic   dysfunction). - Aortic valve: There was mild regurgitation. - Atrial septum: No defect or patent foramen ovale was identified.  Assessment and Plan:  1.  Recent intermittent chest pain as outlined above.  Previous ischemic testing has been reassuring and she has a long-standing history of chest pain.  At this time plan is to initiate Imdur 15 mg daily in the evening.  She has a documented aspirin allergy and otherwise is on beta-blocker.  Office follow-up arranged.  2.  Palpitations, reasonably well controlled at this time on Lopressor at 25 mg twice daily.  3.  Essential hypertension, blood pressure is well controlled today.  4.  Gait instability and falls.  Current medicines were reviewed with the patient today.  Disposition: Follow-up in 3 months.  Signed, Jonelle SidleSamuel G. McDowell, MD, [facility]FACC 04/26/2018 1:46 PM    [facility] [address], [place], [place] [address] Phone: [phone]; Fax: [phone]

## 2018-04-27 ENCOUNTER — Telehealth: Payer: Self-pay | Admitting: Cardiology

## 2018-04-27 NOTE — Telephone Encounter (Signed)
Reviewed & explained medications given at OV yesterday.  Patient verbalized understanding & appreciative of the call back.

## 2018-04-27 NOTE — Telephone Encounter (Signed)
Patient called stating that she needs to ask questions about her medications from yesterdays office visit.

## 2018-06-23 ENCOUNTER — Ambulatory Visit (INDEPENDENT_AMBULATORY_CARE_PROVIDER_SITE_OTHER): Payer: Medicare Other | Admitting: Urology

## 2018-06-23 DIAGNOSIS — N3021 Other chronic cystitis with hematuria: Secondary | ICD-10-CM

## 2018-06-23 DIAGNOSIS — N3941 Urge incontinence: Secondary | ICD-10-CM

## 2018-07-27 ENCOUNTER — Other Ambulatory Visit: Payer: Self-pay | Admitting: *Deleted

## 2018-07-27 MED ORDER — ISOSORBIDE MONONITRATE ER 30 MG PO TB24: 15.0000 mg | ORAL_TABLET | Freq: Every day | ORAL | 2 refills | Status: DC

## 2018-08-02 ENCOUNTER — Ambulatory Visit (INDEPENDENT_AMBULATORY_CARE_PROVIDER_SITE_OTHER): Payer: Medicare Other | Admitting: Cardiology

## 2018-08-02 ENCOUNTER — Encounter: Payer: Self-pay | Admitting: Cardiology

## 2018-08-02 VITALS — BP 138/68 | HR 75 | Ht 60.0 in | Wt 103.8 lb

## 2018-08-02 DIAGNOSIS — I10 Essential (primary) hypertension: Secondary | ICD-10-CM

## 2018-08-02 DIAGNOSIS — R002 Palpitations: Secondary | ICD-10-CM | POA: Diagnosis not present

## 2018-08-02 NOTE — Progress Notes (Signed)
Cardiology Office Note  Date: 08/02/2018   ID: Crystal Lopez, DOB [birth year], MRN [id number]  PCP: Bedelia PersonAaron, Caren T, MD  Primary Cardiologist: Nona DellSamuel McDowell, MD   Chief Complaint  Patient presents with  . Cardiac follow-up    History of Present Illness: Crystal Lopez is an 83 y.o. female last seen in August.  She is here today with her husband for a follow-up visit.  She still reports intermittent, vague palpitations and atypical chest discomfort as before.  The symptoms have not worsened.  Inadvertently, she had 2 different prescriptions, one for Toprol-XL and one for Lopressor.  She has not been taking these together, but seems to notice a difference in her palpitations between the two, cannot pinpoint it today.  She also reports chronic leg pain, neuropathic symptoms, headaches.  Past Medical History:  Diagnosis Date  . Anxiety   . Aspirin allergy   . B12 deficiency   . Chest discomfort    Previous negative ischemic workup  . DDD (degenerative disc disease), cervical   . Gait instability   . GERD (gastroesophageal reflux disease)   . Hip fx (HCC)   . Hypertension   . Migraines   . Osteoporosis   . Overactive bladder   . Palpitations   . Restless leg   . Rib pain    March, 2012, injury from a fall  . Sleep apnea    Suspected diagnosis    Past Surgical History:  Procedure Laterality Date  . APPENDECTOMY    . EXPLORATORY LAPAROTOMY    . Hysterectomy    . L temple cut      Current Outpatient Medications  Medication Sig Dispense Refill  . amitriptyline (ELAVIL) 10 MG tablet Take 1 tablet (10 mg total) by mouth at bedtime. (Patient taking differently: Take 20 mg by mouth at bedtime. ) 30 tablet 3  . benzonatate (TESSALON) 100 MG capsule Take 100 mg by mouth daily as needed for cough.     . clonazePAM (KLONOPIN) 2 MG tablet Take 1-2 mg by mouth 2 (two) times daily. Prescribed 1mg  twice daily and 2mg  at bedtime    . cyanocobalamin (,VITAMIN B-12,) 1000 MCG/ML  injection Inject 1,000 mcg into the muscle once.    Marland Kitchen. Dextromethorphan-guaiFENesin (CHILDRENS COUGH) 5-100 MG/5ML LIQD Take 5 mLs by mouth daily as needed (cough).    . fluticasone (FLONASE) 50 MCG/ACT nasal spray Place 1 spray into both nostrils daily as needed for allergies or rhinitis.    Marland Kitchen. gabapentin (NEURONTIN) 100 MG capsule Take 300 mg by mouth at bedtime.    Marland Kitchen. HYDROcodone-acetaminophen (NORCO/VICODIN) 5-325 MG tablet 1 tab PO q8 hours prn pain 12 tablet 0  . isosorbide mononitrate (IMDUR) 30 MG 24 hr tablet Take 0.5 tablets (15 mg total) by mouth daily. 45 tablet 2  . magnesium oxide (MAG-OX) 400 MG tablet Take 400 mg by mouth daily.    . methenamine (HIPREX) 1 g tablet Take 1 g by mouth 2 (two) times daily with a meal.    . metoprolol tartrate (LOPRESSOR) 25 MG tablet Take 1 tablet (25 mg total) by mouth 2 (two) times daily. 180 tablet 0  . nitroGLYCERIN (NITROSTAT) 0.3 MG SL tablet Place 1 tablet (0.3 mg total) under the tongue every 5 (five) minutes as needed for chest pain. 25 tablet 3  . ondansetron (ZOFRAN ODT) 4 MG disintegrating tablet Take 1 tablet (4 mg total) by mouth every 8 (eight) hours as needed for nausea or vomiting. 20 tablet  0  . ranitidine (ZANTAC) 150 MG tablet Take 300 mg by mouth daily as needed for heartburn.      No current facility-administered medications for this visit.    Allergies:  Codeine; Darvocet [propoxyphene n-acetaminophen]; Dilaudid [hydromorphone hcl]; Morphine and related; Boniva [ibandronate sodium]; Depakote er [divalproex sodium er]; Doxycycline; Levsin [hyoscyamine sulfate]; Macrodantin; Prilosec [omeprazole]; Ropinirole hcl; Sulfa antibiotics; Tegretol [carbamazepine]; Topamax [topiramate]; Aspirin; and Erythromycin   Social History: The patient  reports that she quit smoking about 53 years ago. Her smoking use included cigarettes. She has a 14.00 pack-year smoking history. She has never used smokeless tobacco. She reports that she does not drink  alcohol or use drugs.   ROS:  Please see the history of present illness. Otherwise, complete review of systems is positive for hearing loss.  All other systems are reviewed and negative.   Physical Exam: VS:  BP 138/68   Pulse 75   Ht 5' (1.524 m)   Wt 103 lb 12.8 oz (47.1 kg)   SpO2 96%   BMI 20.27 kg/m , BMI Body mass index is 20.27 kg/m.  Wt Readings from Last 3 Encounters:  08/02/18 103 lb 12.8 oz (47.1 kg)  04/26/18 101 lb (45.8 kg)  04/04/18 105 lb (47.6 kg)    General: Elderly woman, appears comfortable at rest. HEENT: Conjunctiva and lids normal, oropharynx clear. Neck: Supple, no elevated JVP or carotid bruits, no thyromegaly. Lungs: Clear to auscultation, nonlabored breathing at rest. Cardiac: Regular rate and rhythm, no S3 or significant systolic murmur. Abdomen: Soft, nontender, bowel sounds present. Extremities: No pitting edema, distal pulses 2+.  ECG: I personally reviewed the tracing from 03/01/2018 which showed sinus rhythm.  Recent Labwork: 04/04/2018: BUN 9; Creatinine, Ser 0.82; Hemoglobin 10.3; Platelets 255; Potassium 3.6; Sodium 134   Other Studies Reviewed Today:  Echocardiogram 12/11/2016: Study Conclusions  - Left ventricle: The cavity size was normal. Systolic function was normal. The estimated ejection fraction was in the range of 60% to 65%. Wall motion was normal; there were no regional wall motion abnormalities. Doppler parameters are consistent with abnormal left ventricular relaxation (grade 1 diastolic dysfunction). - Aortic valve: There was mild regurgitation. - Atrial septum: No defect or patent foramen ovale was identified.  Assessment and Plan:  1.  Intermittent palpitations and atypical chest pain.  No escalation in symptoms.  No syncope.  She will compare symptom control by using Toprol-XL for 3 to 5 days and then Lopressor for 3 to 5 days.  Which ever one is more effective we will refill going forward.  2.  Essential  hypertension, blood pressure control is adequate today.  Keep follow-up with PCP.  Current medicines were reviewed with the patient today.  Disposition: Follow-up in 6 months.  Signed, Jonelle Sidle, MD, [facility] 08/02/2018 1:35 PM    [facility] at [facility] [address] [place], [place], [place] [address] Phone: [phone]; Fax: [phone]

## 2018-08-02 NOTE — Patient Instructions (Addendum)
Medication Instructions:   Your physician has recommended you make the following change in your medication:   Take metoprolol succinate (Toprol XL) 12.5 mg by mouth daily for 3-7 days, then switch to metoprolol tartrate (Lopressor) 12.5 mg by mouth twice daily.   Contact our office when you determine which form helps your symptoms the most and we will send your refill in to your pharmacy.  Take your isosorbide mononitrate 15 mg in the mornings.  Continue all other medications the same.  Labwork:  NONE  Testing/Procedures:  NONE  Follow-Up:  Your physician recommends that you schedule a follow-up appointment in: 6 months. You will receive a reminder letter in the mail in about 4 months reminding you to call and schedule your appointment. If you don't receive this letter, please contact our office.  Any Other Special Instructions Will Be Listed Below (If Applicable).  If you need a refill on your cardiac medications before your next appointment, please call your pharmacy.

## 2018-08-04 ENCOUNTER — Ambulatory Visit (INDEPENDENT_AMBULATORY_CARE_PROVIDER_SITE_OTHER): Payer: Medicare Other | Admitting: Urology

## 2018-08-04 DIAGNOSIS — N3021 Other chronic cystitis with hematuria: Secondary | ICD-10-CM

## 2018-08-04 DIAGNOSIS — N3941 Urge incontinence: Secondary | ICD-10-CM

## 2018-09-08 IMAGING — CT CT HEAD W/O CM
4 of 5 series · 16 of 47 positions shown, 18 images · non-contrast
Comparison: 12/13/2016 CTs

CLINICAL DATA: 82-year-old female with acute head and neck injury
following fall yesterday.

EXAM:
CT HEAD WITHOUT CONTRAST
CT CERVICAL SPINE WITHOUT CONTRAST
TECHNIQUE: Multidetector CT imaging of the head and cervical spine was
performed following the standard protocol without intravenous
contrast. Multiplanar CT image reconstructions of the cervical spine
were also generated.

[Series 3: head wo · axial · 0.41mm/px · z∈[+1540,+1640]mm · 6 of 29 slices shown, 8 images]
[im 5/29  brain]
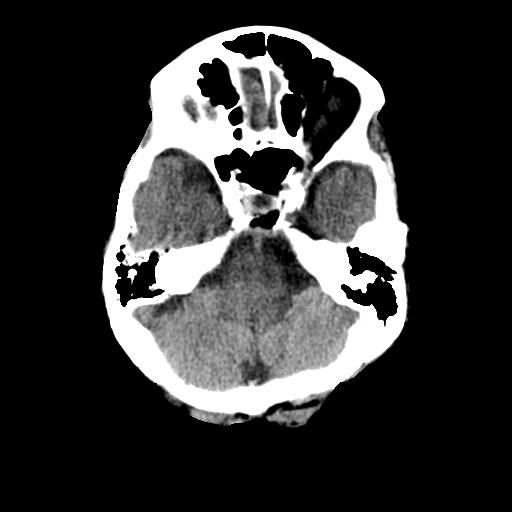
[im 5/29  bone]
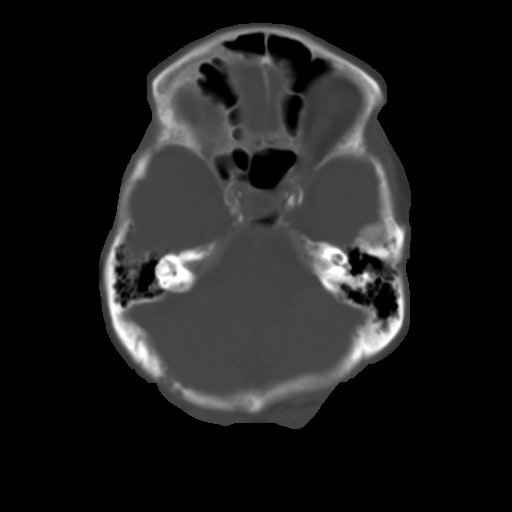
[im 9/29  brain]
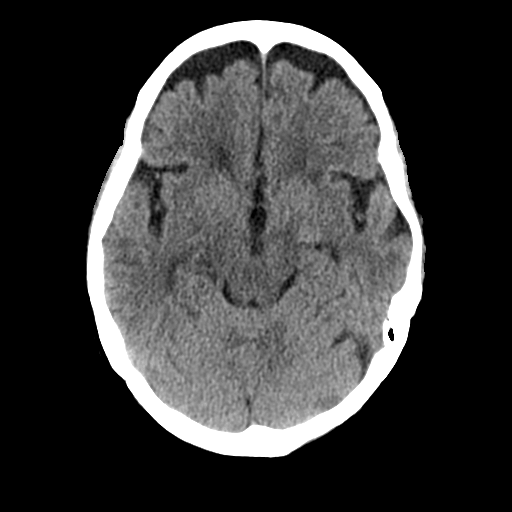
[im 13/29  brain]
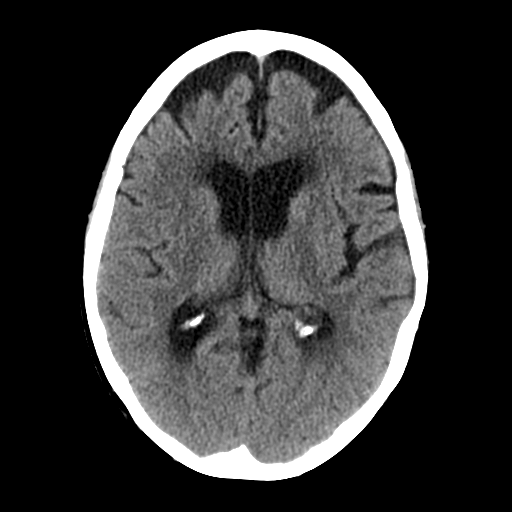
[im 17/29  brain]
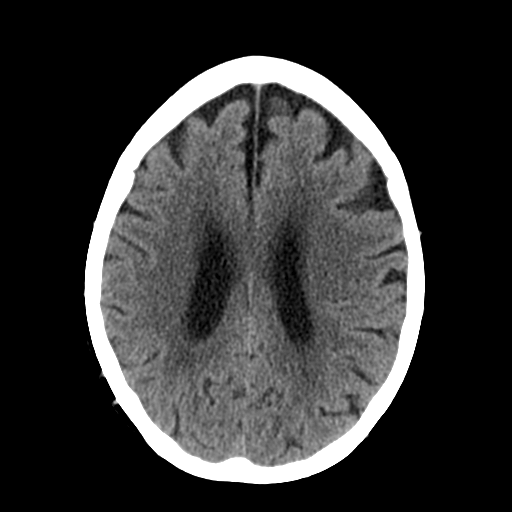
[im 21/29  brain]
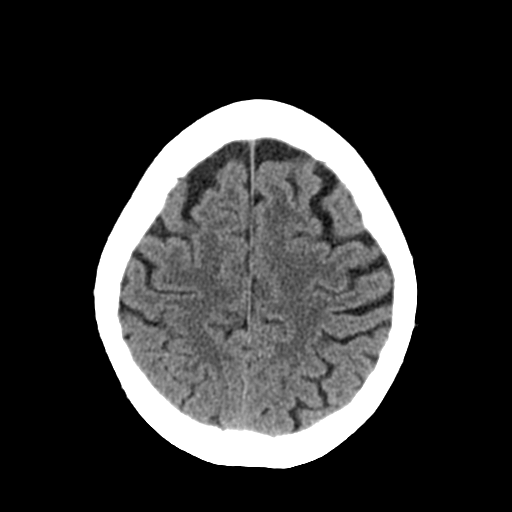
[im 21/29  bone]
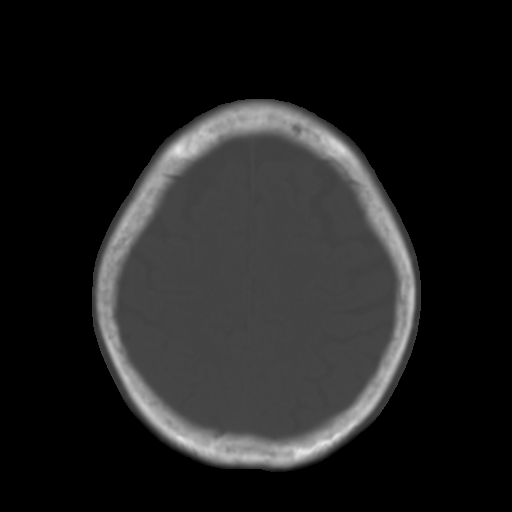
[im 25/29  brain]
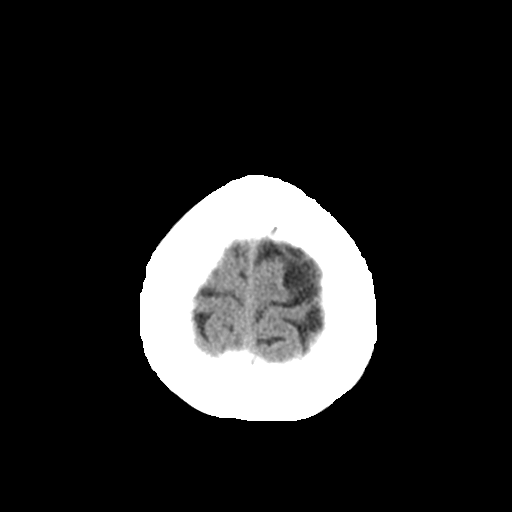

[Series 5: coronal soft tissue · coronal · 0.30mm/px · 3 of 74 slices shown]
[im 25/74  brain]
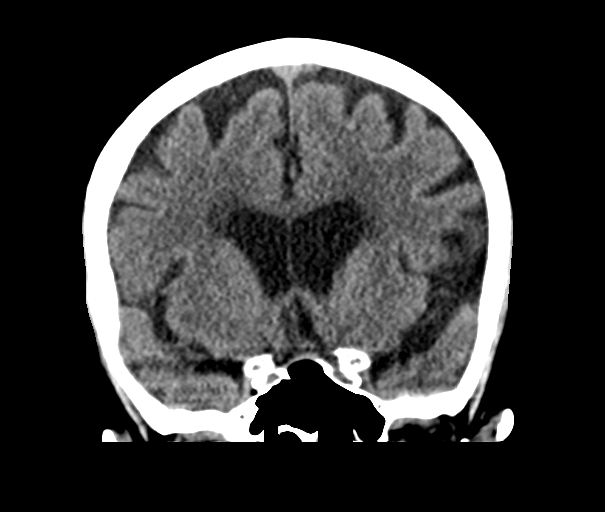
[im 33/74  brain]
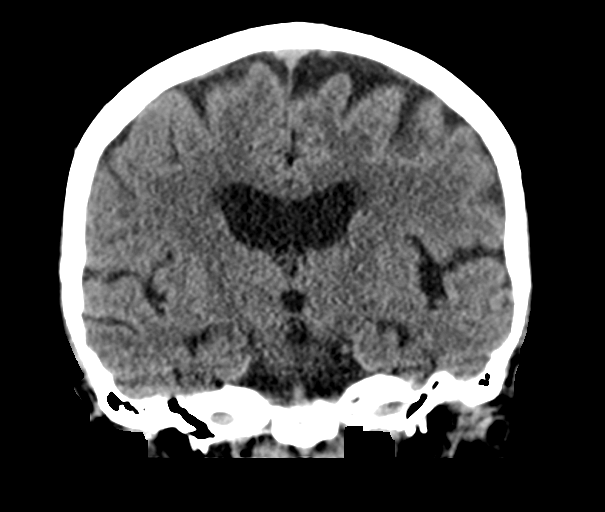
[im 41/74  brain]
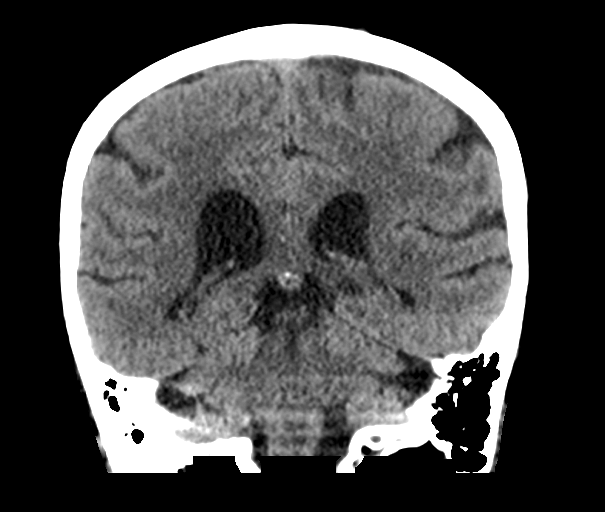

[Series 6: sagittal soft tissue · sagittal · 0.28mm/px · 1 of 58 slices shown]
[im 29/58  brain]
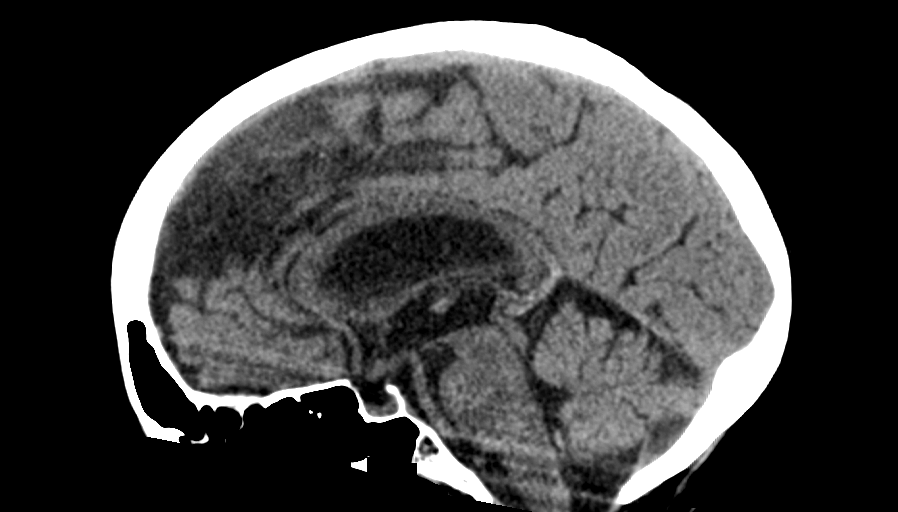

[Series 8: c spine soft · axial · 0.44mm/px · z∈[+1378,+1472]mm · 6 of 83 slices shown]
[im 8/83  brain]
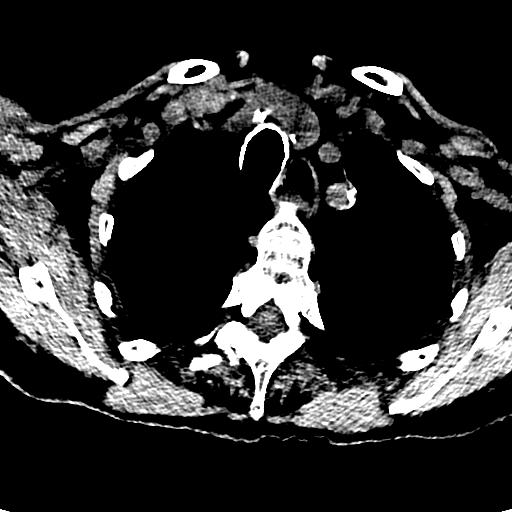
[im 16/83  brain]
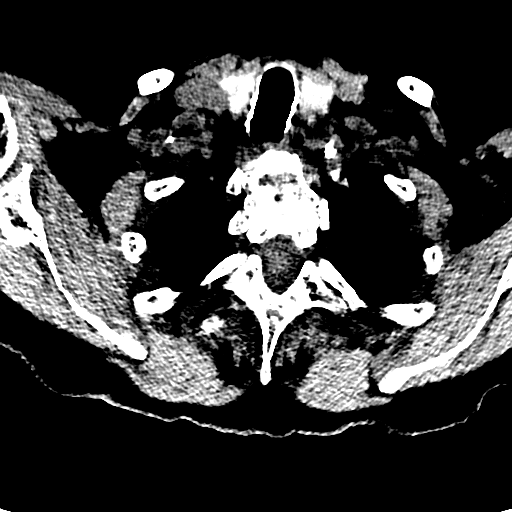
[im 28/83  brain]
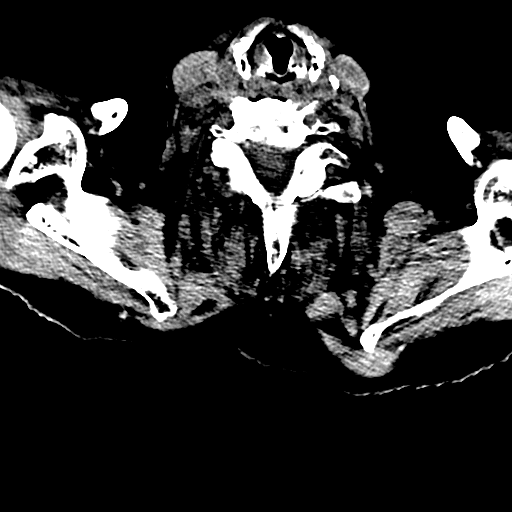
[im 36/83  brain]
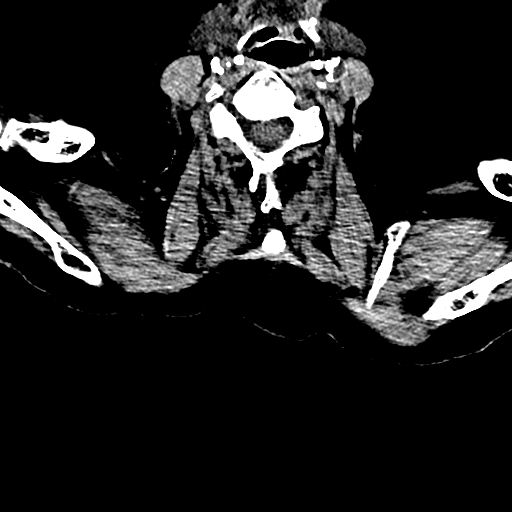
[im 47/83  brain]
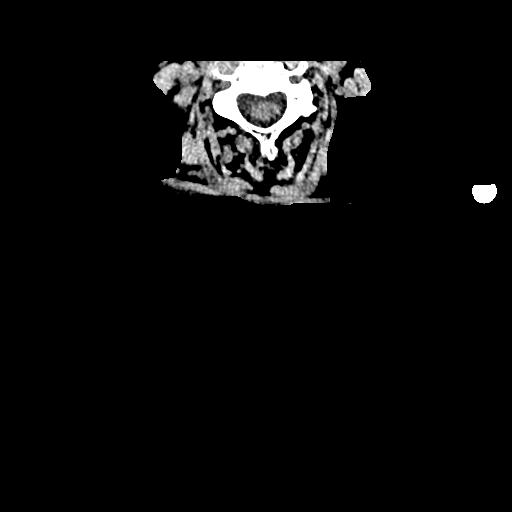
[im 55/83  brain]
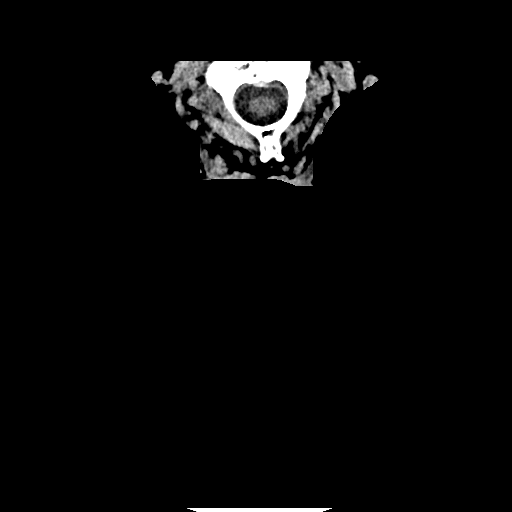

[16 of 47 positions shown; findings below may reference images not displayed]

FINDINGS: CT HEAD FINDINGS

Brain: No evidence of acute infarction, hemorrhage, hydrocephalus,
extra-axial collection or mass lesion/mass effect.

Atrophy and chronic small-vessel white matter ischemic changes again
noted.

Vascular: Carotid atherosclerotic calcifications again noted.

Skull: Normal. Negative for fracture or focal lesion.

Sinuses/Orbits: No acute finding.

Other: None.

CT CERVICAL SPINE FINDINGS

Alignment: Normal.

Skull base and vertebrae: No acute fracture or suspicious focal bony
lesions. 20% compression fractures of the T2 and T4 SUPERIOR
endplates are new since 12/27/2016 but appear subacute to remote.

Soft tissues and spinal canal: No prevertebral fluid or swelling. No
visible canal hematoma.

Disc levels: Mild multilevel degenerative disc disease/spondylosis
and facet arthropathy again noted.

Upper chest: No acute abnormality.

Other: None
IMPRESSION: 1. No evidence of acute intracranial abnormality. Atrophy and
chronic small-vessel white matter ischemic changes
2. No static evidence of acute injury to the cervical spine.
3. T12 in T4 SUPERIOR endplate compression fractures, new since
12/27/2016 but appear subacute to remote. Correlate clinically.

## 2018-10-28 ENCOUNTER — Telehealth: Payer: Self-pay | Admitting: Cardiology

## 2018-10-28 NOTE — Telephone Encounter (Signed)
Pt c/o 4-5 chest pain "episodes" this past month lasting about 5-10 minutes at a time with some SOB (most recent episode happened last night) has taken 4-5 NTG tablets this month when episodes occur - doesn't know HR/BP - says she has had bronchitis and started OTC Coricidin - also taking Lopressor 12.5 mg daily (says medicine gave her a headache when she was taking 25 mg bid) has f/u in May but wanting to know if she should be seen before this or just continue to monitor symptoms

## 2018-10-28 NOTE — Telephone Encounter (Signed)
Pt verified Lopressor 12.5 mg daily and she will try to take 12.5 mg bid - also will call us back if symptoms become more frequent - did not want to make an appt with extender at this time

## 2018-10-28 NOTE — Telephone Encounter (Signed)
She has a longstanding history of recurring atypical chest pain and palpitations.  If symptoms are increasing in frequency we may need to consider follow-up ischemic testing, although prior evaluations have been reassuring.  Please make sure that if she is taking Lopressor 12.5 mg, this is twice daily and not once daily.  Keep scheduled visit with me, consider APP visit in the interim if symptoms increase.

## 2018-10-28 NOTE — Telephone Encounter (Signed)
Patient wants to speak with someone about her medication She said that she has been having episodes where she needed to talk Nitro. Has happened about 2-3 times and she knew she should have gone to ED but didnt

## 2018-11-10 ENCOUNTER — Ambulatory Visit: Payer: Medicare Other | Admitting: Urology

## 2018-11-15 ENCOUNTER — Encounter (HOSPITAL_COMMUNITY): Payer: Self-pay | Admitting: Emergency Medicine

## 2018-11-15 ENCOUNTER — Emergency Department (HOSPITAL_COMMUNITY): Payer: Medicare Other

## 2018-11-15 ENCOUNTER — Other Ambulatory Visit: Payer: Self-pay

## 2018-11-15 ENCOUNTER — Emergency Department (HOSPITAL_COMMUNITY)
Admission: EM | Admit: 2018-11-15 | Discharge: 2018-11-15 | Disposition: A | Discharge status: Home / Self Care | Payer: Medicare Other | Attending: Emergency Medicine | Admitting: Emergency Medicine

## 2018-11-15 DIAGNOSIS — Z87891 Personal history of nicotine dependence: Secondary | ICD-10-CM | POA: Diagnosis not present

## 2018-11-15 DIAGNOSIS — I10 Essential (primary) hypertension: Secondary | ICD-10-CM | POA: Insufficient documentation

## 2018-11-15 DIAGNOSIS — J101 Influenza due to other identified influenza virus with other respiratory manifestations: Secondary | ICD-10-CM | POA: Insufficient documentation

## 2018-11-15 DIAGNOSIS — J111 Influenza due to unidentified influenza virus with other respiratory manifestations: Secondary | ICD-10-CM

## 2018-11-15 DIAGNOSIS — Z79899 Other long term (current) drug therapy: Secondary | ICD-10-CM | POA: Diagnosis not present

## 2018-11-15 DIAGNOSIS — R05 Cough: Secondary | ICD-10-CM | POA: Diagnosis present

## 2018-11-15 DIAGNOSIS — R69 Illness, unspecified: Secondary | ICD-10-CM

## 2018-11-15 LAB — COMPREHENSIVE METABOLIC PANEL
ALBUMIN: 4 g/dL (ref 3.5–5.0)
ALT: 16 U/L (ref 0–44)
ANION GAP: 11 (ref 5–15)
AST: 29 U/L (ref 15–41)
Alkaline Phosphatase: 37 U/L — ABNORMAL LOW (ref 38–126)
BUN: 10 mg/dL (ref 8–23)
CHLORIDE: 92 mmol/L — AB (ref 98–111)
CO2: 26 mmol/L (ref 22–32)
Calcium: 8.6 mg/dL — ABNORMAL LOW (ref 8.9–10.3)
Creatinine, Ser: 0.77 mg/dL (ref 0.44–1.00)
GFR calc Af Amer: >60 mL/min (ref >60)
GFR calc non Af Amer: >60 mL/min (ref >60)
GLUCOSE: 107 mg/dL — AB (ref 70–99)
POTASSIUM: 3.4 mmol/L — AB (ref 3.5–5.1)
SODIUM: 129 mmol/L — AB (ref 135–145)
Total Bilirubin: 0.4 mg/dL (ref 0.3–1.2)
Total Protein: 7.7 g/dL (ref 6.5–8.1)

## 2018-11-15 LAB — CBC WITH DIFFERENTIAL/PLATELET
Abs Immature Granulocytes: 0.01 10*3/uL (ref 0.00–0.07)
BASOS ABS: 0 10*3/uL (ref 0.0–0.1)
Basophils Relative: 0 %
EOS ABS: 0 10*3/uL (ref 0.0–0.5)
EOS PCT: 0 %
HCT: 35.5 % — ABNORMAL LOW (ref 36.0–46.0)
HEMOGLOBIN: 11.1 g/dL — AB (ref 12.0–15.0)
Immature Granulocytes: 0 %
LYMPHS PCT: 29 %
Lymphs Abs: 1.2 10*3/uL (ref 0.7–4.0)
MCH: 26.6 pg (ref 26.0–34.0)
MCHC: 31.3 g/dL (ref 30.0–36.0)
MCV: 85.1 fL (ref 80.0–100.0)
Monocytes Absolute: 0.6 10*3/uL (ref 0.1–1.0)
Monocytes Relative: 14 %
NRBC: 0 % (ref 0.0–0.2)
Neutro Abs: 2.3 10*3/uL (ref 1.7–7.7)
Neutrophils Relative %: 57 %
Platelets: 181 10*3/uL (ref 150–400)
RBC: 4.17 MIL/uL (ref 3.87–5.11)
RDW: 14.1 % (ref 11.5–15.5)
WBC: 4.1 10*3/uL (ref 4.0–10.5)

## 2018-11-15 LAB — URINALYSIS, ROUTINE W REFLEX MICROSCOPIC
BACTERIA UA: NONE SEEN
BILIRUBIN URINE: NEGATIVE
Glucose, UA: NEGATIVE mg/dL
KETONES UR: 5 mg/dL — AB
LEUKOCYTE UA: NEGATIVE
Nitrite: NEGATIVE
PH: 6 (ref 5.0–8.0)
Protein, ur: NEGATIVE mg/dL
Specific Gravity, Urine: 1.012 (ref 1.005–1.030)

## 2018-11-15 LAB — INFLUENZA PANEL BY PCR (TYPE A & B)
INFLAPCR: POSITIVE — AB
INFLBPCR: NEGATIVE

## 2018-11-15 MED ORDER — OSELTAMIVIR PHOSPHATE 75 MG PO CAPS
75.0000 mg | ORAL_CAPSULE | Freq: Once | ORAL | Status: AC
  Administered 2018-11-15: 75 mg via ORAL
  Filled 2018-11-15: qty 1

## 2018-11-15 MED ORDER — SODIUM CHLORIDE 0.9 % IV BOLUS (SEPSIS)
1000.0000 mL | Freq: Once | INTRAVENOUS | Status: AC
  Administered 2018-11-15: 1000 mL via INTRAVENOUS

## 2018-11-15 MED ORDER — OSELTAMIVIR PHOSPHATE 75 MG PO CAPS: 75.0000 mg | ORAL_CAPSULE | Freq: Two times a day (BID) | ORAL | 0 refills | Status: AC

## 2018-11-15 NOTE — ED Notes (Signed)
Back from xray

## 2018-11-15 NOTE — Discharge Instructions (Addendum)
Drink plenty of fluids rest follow-up with your doctor in a couple days for recheck

## 2018-11-15 NOTE — ED Provider Notes (Signed)
[facility] EMERGENCY DEPARTMENT Provider Note   CSN: [id number] Arrival date & time: 11/15/18  1700    History   Chief Complaint Chief Complaint  Patient presents with  . Cough    HPI Crystal Lopez is a 84 y.o. female.     Patient complains of cough shortness of breath weakness since Tuesday.  She slipped to the floor today but no injuries  The history is provided by the patient. No language interpreter was used.  Cough  Cough characteristics:  Productive and non-productive Sputum characteristics:  Nondescript Severity:  Moderate Onset quality:  Sudden Timing:  Constant Progression:  Worsening Chronicity:  New Smoker: no   Associated symptoms: no chest pain, no eye discharge, no headaches and no rash     Past Medical History:  Diagnosis Date  . Anxiety   . Aspirin allergy   . B12 deficiency   . Chest discomfort    Previous negative ischemic workup  . DDD (degenerative disc disease), cervical   . Gait instability   . GERD (gastroesophageal reflux disease)   . Hip fx (HCC)   . Hypertension   . Migraines   . Osteoporosis   . Overactive bladder   . Palpitations   . Restless leg   . Rib pain    March, 2012, injury from a fall  . Sleep apnea    Suspected diagnosis    Patient Active Problem List   Diagnosis Date Noted  . Acute metabolic encephalopathy 12/14/2016  . Lung nodule 12/14/2016  . CAP (community acquired pneumonia) 12/13/2016  . UTI (urinary tract infection) 12/13/2016  . Intractable migraine 03/13/2016  . Sleep apnea   . Ejection fraction   . Aspirin allergy   . Palpitations   . GERD (gastroesophageal reflux disease)   . Chest discomfort   . Shortness of breath     Past Surgical History:  Procedure Laterality Date  . APPENDECTOMY    . EXPLORATORY LAPAROTOMY    . Hysterectomy    . L temple cut       OB History   No obstetric history on file.      Home Medications    Prior to Admission medications   Medication Sig Start Date  End Date Taking? Authorizing Provider  alfuzosin (UROXATRAL) 10 MG 24 hr tablet Take 10 mg by mouth daily with breakfast.   Yes [provider]  amitriptyline (ELAVIL) 10 MG tablet Take 1 tablet (10 mg total) by mouth at bedtime. 03/17/16  Yes Anson Fret, MD  amoxicillin-clavulanate (AUGMENTIN) 875-125 MG tablet Take 1 tablet by mouth 2 (two) times daily. 10 day course starting on 11/10/2018   Yes [provider]  benzonatate (TESSALON) 100 MG capsule Take 100 mg by mouth daily as needed for cough.    Yes [provider]  clonazePAM (KLONOPIN) 2 MG tablet Take 1-2 mg by mouth 2 (two) times daily. Prescribed  twice daily and  at bedtime   Yes [provider]  cyanocobalamin (,VITAMIN B-12,) 1000 MCG/ML injection Inject 1,000 mcg into the muscle every 30 (thirty) days.    Yes [provider]  fluticasone (FLONASE) 50 MCG/ACT nasal spray Place 1 spray into both nostrils daily as needed for allergies or rhinitis.   Yes [provider]  gabapentin (NEURONTIN) 100 MG capsule Take 200 mg by mouth at bedtime.    Yes [provider]  HYDROcodone-acetaminophen (NORCO/VICODIN) 5-325 MG tablet 1 tab PO q8 hours prn pain Patient taking differently: Take  0.5-1 tablets by mouth every 8 (eight) hours as needed for moderate pain.  04/04/18  Yes Samuel Jester, DO  HYDROcodone-homatropine (HYDROMET) 5-1.5 MG/5ML syrup Take 5 mLs by mouth every 6 (six) hours as needed for cough.   Yes [provider]  isosorbide mononitrate (IMDUR) 30 MG 24 hr tablet Take 0.5 tablets (15 mg total) by mouth daily. 07/27/18 11/15/18 Yes Jonelle Sidle, MD  Magnesium 200 MG TABS Take 400 mg by mouth every evening.    Yes [provider]  metoprolol succinate (TOPROL-XL) 25 MG 24 hr tablet Take 25 mg by mouth daily.   Yes [provider]  nitroGLYCERIN (NITROSTAT) 0.3 MG SL tablet Place 1 tablet (0.3 mg total) under the tongue every 5  (five) minutes as needed for chest pain. 04/26/18  Yes Jonelle Sidle, MD  ondansetron (ZOFRAN ODT) 4 MG disintegrating tablet Take 1 tablet (4 mg total) by mouth every 8 (eight) hours as needed for nausea or vomiting. 12/16/16  Yes Philip Aspen, Limmie Patricia, MD  silodosin (RAPAFLO) 8 MG CAPS capsule Take 8 mg by mouth daily.   Yes [provider]  oseltamivir (TAMIFLU) 75 MG capsule Take 1 capsule (75 mg total) by mouth every 12 (twelve) hours. 11/15/18   Bethann Berkshire, MD  ranitidine (ZANTAC) 150 MG tablet Take 300 mg by mouth daily as needed for heartburn.     [provider]    Family History Family History  Problem Relation Age of Onset  . Heart failure Mother   . Heart disease Father   . Heart failure Father   . Heart disease Sister   . Heart attack Sister   . Heart disease Brother   . Heart attack Brother   . Migraines Neg Hx     Social History Social History   Tobacco Use  . Smoking status: Former Smoker    Packs/day: 2.00    Years: 7.00    Pack years: 14.00    Types: Cigarettes    Last attempt to quit: 09/08/1964    Years since quitting: 54.2  . Smokeless tobacco: Never Used  Substance Use Topics  . Alcohol use: No    Alcohol/week: 0.0 standard drinks  . Drug use: No     Allergies   Codeine; Darvocet [propoxyphene n-acetaminophen]; Dilaudid [hydromorphone hcl]; Morphine and related; Boniva [ibandronate sodium]; Depakote er [divalproex sodium er]; Doxycycline; Levaquin [levofloxacin]; Macrodantin; Prilosec [omeprazole]; Ropinirole hcl; Sulfa antibiotics; Tegretol [carbamazepine]; Topamax [topiramate]; Aspirin; and Erythromycin   Review of Systems Review of Systems  Constitutional: Negative for appetite change and fatigue.  HENT: Negative for congestion, ear discharge and sinus pressure.   Eyes: Negative for discharge.  Respiratory: Positive for cough.   Cardiovascular: Negative for chest pain.  Gastrointestinal: Negative for abdominal pain  and diarrhea.  Genitourinary: Negative for frequency and hematuria.  Musculoskeletal: Negative for back pain.  Skin: Negative for rash.  Neurological: Negative for seizures and headaches.  Psychiatric/Behavioral: Negative for hallucinations.     Physical Exam Updated Vital Signs BP 128/63   Pulse 79   Temp 98.7 F (37.1 C) (Oral)   Resp 18   Ht 4\' 7"  (1.397 m)   Wt 46.7 kg   SpO2 95%   BMI 23.94 kg/m   Physical Exam Vitals signs and nursing note reviewed.  Constitutional:      Appearance: She is well-developed.  HENT:     Head: Normocephalic.     Nose: Nose normal.  Eyes:     General:  No scleral icterus.    Conjunctiva/sclera: Conjunctivae normal.  Neck:     Musculoskeletal: Neck supple.     Thyroid: No thyromegaly.  Cardiovascular:     Rate and Rhythm: Normal rate and regular rhythm.     Heart sounds: No murmur. No friction rub. No gallop.   Pulmonary:     Breath sounds: No stridor. Rales present. No wheezing.  Chest:     Chest wall: No tenderness.  Abdominal:     General: There is no distension.     Tenderness: There is no abdominal tenderness. There is no rebound.  Musculoskeletal: Normal range of motion.  Lymphadenopathy:     Cervical: No cervical adenopathy.  Skin:    Findings: No erythema or rash.  Neurological:     Mental Status: She is oriented to person, place, and time.     Motor: No abnormal muscle tone.     Coordination: Coordination normal.  Psychiatric:        Behavior: Behavior normal.      ED Treatments / Results  Labs (all labs ordered are listed, but only abnormal results are displayed) Labs Reviewed  CBC WITH DIFFERENTIAL/PLATELET - Abnormal; Notable for the following components:      Result Value   Hemoglobin 11.1 (*)    HCT 35.5 (*)    All other components within normal limits  COMPREHENSIVE METABOLIC PANEL - Abnormal; Notable for the following components:   Sodium 129 (*)    Potassium 3.4 (*)    Chloride 92 (*)    Glucose,  Bld 107 (*)    Calcium 8.6 (*)    Alkaline Phosphatase 37 (*)    All other components within normal limits  URINALYSIS, ROUTINE W REFLEX MICROSCOPIC - Abnormal; Notable for the following components:   APPearance HAZY (*)    Hgb urine dipstick MODERATE (*)    Ketones, ur 5 (*)    All other components within normal limits  INFLUENZA PANEL BY PCR (TYPE A & B) - Abnormal; Notable for the following components:   Influenza A By PCR POSITIVE (*)    All other components within normal limits    EKG None  Radiology Dg Chest 2 View  Result Date: 11/15/2018 CLINICAL DATA:  Shortness of breath EXAM: CHEST - 2 VIEW COMPARISON:  Chest CT 04/04/2018 FINDINGS: Borderline heart size. Negative aortic and hilar contours. Exaggerated thoracic kyphosis with multilevel compression fracture that appears stable from 2019 chest CT. IMPRESSION: No evidence of acute disease. Electronically Signed   By: Marnee Spring M.D.   On: 11/15/2018 18:42    Procedures Procedures (including critical care time)  Medications Ordered in ED Medications  oseltamivir (TAMIFLU) capsule 75 mg (has no administration in time range)  sodium chloride 0.9 % bolus 1,000 mL (1,000 mLs Intravenous New Bag/Given 11/15/18 1813)     Initial Impression / Assessment and Plan / ED Course  I have reviewed the triage vital signs and the nursing notes.  Pertinent labs & imaging results that were available during my care of the patient were reviewed by me and considered in my medical decision making (see chart for details).        Patient with influenza.  Patient prefers to be treated as an outpatient.  Vital signs stable.  She is nontoxic.  She will be placed on Tamiflu and will follow-up with PCP  Final Clinical Impressions(s) / ED Diagnoses   Final diagnoses:  Influenza-like illness    ED Discharge Orders  Ordered    oseltamivir (TAMIFLU) 75 MG capsule  Every 12 hours     11/15/18 2126           Bethann Berkshire,  MD 11/15/18 2132

## 2018-11-15 NOTE — ED Triage Notes (Signed)
Seen at urgent care wed for cough.  Given abx and steroids.  Does not feel any better.  Feels weak, nauseated and is still coughing.

## 2018-11-15 NOTE — ED Notes (Signed)
Going for xray  

## 2019-01-18 ENCOUNTER — Telehealth: Payer: Self-pay | Admitting: *Deleted

## 2019-01-18 NOTE — Telephone Encounter (Signed)
The patient verbally consented for a telehealth VIDEO visit with [facility] and understands that his/her insurance company will be billed for the encounter.  Phone number for visit is in notes, belongs to her grand-daughter Sharyl Nimrod) who will be present during visit.    Asked patient to have weight & medications ready for review.  Does not have BP cuff.

## 2019-01-20 NOTE — Progress Notes (Deleted)
{Choose 1 Note Type (Telehealth Visit or Telephone Visit):[id number]}   Date:  01/20/2019   ID:  Crystal Lopez, DOB 11-09-1934, MRN [id number]  {Patient [phone]::"Home"} {Provider [phone]::"Home"}  PCP:  Bedelia PersonAaron, Caren T, MD  Cardiologist:  Nona DellSamuel Icey Tello, MD   Evaluation Performed:  {Choose Visit Type:[id number]::"Follow-Up Visit"}  Chief Complaint:  ***  History of Present Illness:    Crystal StackBetty J Bertelson is an 84 y.o. female last seen in November 2019.  Records indicate that she had influenza A back in March.  The patient {does/does not:200015} have symptoms concerning for COVID-19 infection (fever, chills, cough, or new shortness of breath).    Past Medical History:  Diagnosis Date  . Anxiety   . Aspirin allergy   . B12 deficiency   . Chest discomfort    Previous negative ischemic workup  . DDD (degenerative disc disease), cervical   . Gait instability   . GERD (gastroesophageal reflux disease)   . Hip fx (HCC)   . Hypertension   . Migraines   . Osteoporosis   . Overactive bladder   . Palpitations   . Restless leg   . Rib pain    March, 2012, injury from a fall  . Sleep apnea    Suspected diagnosis   Past Surgical History:  Procedure Laterality Date  . APPENDECTOMY    . EXPLORATORY LAPAROTOMY    . Hysterectomy    . L temple cut       No outpatient medications have been marked as taking for the 01/21/19 encounter (Appointment) with Jonelle SidleMcDowell, Taeshaun Rames G, MD.     Allergies:   Codeine; Darvocet [propoxyphene n-acetaminophen]; Dilaudid [hydromorphone hcl]; Morphine and related; Boniva [ibandronate sodium]; Depakote er [divalproex sodium er]; Doxycycline; Levaquin [levofloxacin]; Macrodantin; Prilosec [omeprazole]; Ropinirole hcl; Sulfa antibiotics; Tegretol [carbamazepine]; Topamax [topiramate]; Aspirin; and Erythromycin   Social History   Tobacco Use  . Smoking status: Former Smoker    Packs/day: 2.00    Years: 7.00    Pack years: 14.00   Types: Cigarettes    Last attempt to quit: 09/08/1964    Years since quitting: 54.4  . Smokeless tobacco: Never Used  Substance Use Topics  . Alcohol use: No    Alcohol/week: 0.0 standard drinks  . Drug use: No     Family Hx: The patient's family history includes Heart attack in her brother and sister; Heart disease in her brother, father, and sister; Heart failure in her father and mother. There is no history of Migraines.  ROS:   Please see the history of present illness.    *** All other systems reviewed and are negative.   Prior CV studies:   The following studies were reviewed today:  Echocardiogram 12/11/2016: Study Conclusions  - Left ventricle: The cavity size was normal. Systolic function was normal. The estimated ejection fraction was in the range of 60% to 65%. Wall motion was normal; there were no regional wall motion abnormalities. Doppler parameters are consistent with abnormal left ventricular relaxation (grade 1 diastolic dysfunction). - Aortic valve: There was mild regurgitation. - Atrial septum: No defect or patent foramen ovale was identified.  Labs/Other Tests and Data Reviewed:    EKG:  An ECG dated 03/01/2018 was personally reviewed today and demonstrated:  Sinus rhythm.  Recent Labs: 11/15/2018: ALT 16; BUN 10; Creatinine, Ser 0.77; Hemoglobin 11.1; Platelets 181; Potassium 3.4; Sodium 129    Wt Readings from Last 3 Encounters:  11/15/18 103 lb (46.7 kg)  08/02/18 103 lb 12.8 oz (47.1 kg)  04/26/18 101 lb (45.8 kg)     Objective:    Vital Signs:  There were no vitals taken for this visit.   {[facility] Virtual Exam (Optional):[phone]::"VITAL SIGNS:  reviewed"}  ASSESSMENT & PLAN:    1. ***  COVID-19 Education: The signs and symptoms of COVID-19 were discussed with the patient and how to seek care for testing (follow up with PCP or arrange E-visit).  ***The importance of social distancing was discussed today.  Time:   Today, I  have spent *** minutes with the patient with telehealth technology discussing the above problems.     Medication Adjustments/Labs and Tests Ordered: Current medicines are reviewed at length with the patient today.  Concerns regarding medicines are outlined above.   Tests Ordered: No orders of the defined types were placed in this encounter.   Medication Changes: No orders of the defined types were placed in this encounter.   Disposition:  Follow up {follow up:15908}  Signed, Nona Dell, MD  01/20/2019 10:07 AM    [facility]

## 2019-01-21 ENCOUNTER — Telehealth: Payer: Medicare Other | Admitting: Cardiology

## 2019-04-11 ENCOUNTER — Telehealth: Payer: Self-pay | Admitting: Cardiology

## 2019-04-11 NOTE — Telephone Encounter (Signed)

## 2019-04-12 ENCOUNTER — Ambulatory Visit: Payer: Medicare Other | Admitting: Cardiology

## 2019-04-26 ENCOUNTER — Other Ambulatory Visit: Payer: Self-pay | Admitting: Cardiology

## 2019-05-27 ENCOUNTER — Ambulatory Visit: Payer: Medicare Other | Admitting: Cardiology

## 2019-05-29 ENCOUNTER — Emergency Department (HOSPITAL_COMMUNITY): Payer: Medicare Other

## 2019-05-29 ENCOUNTER — Other Ambulatory Visit: Payer: Self-pay

## 2019-05-29 ENCOUNTER — Encounter (HOSPITAL_COMMUNITY): Payer: Self-pay | Admitting: Emergency Medicine

## 2019-05-29 ENCOUNTER — Emergency Department (HOSPITAL_COMMUNITY)
Admission: EM | Admit: 2019-05-29 | Discharge: 2019-05-29 | Disposition: A | Discharge status: Home / Self Care | Payer: Medicare Other | Attending: Emergency Medicine | Admitting: Emergency Medicine

## 2019-05-29 DIAGNOSIS — T17908A Unspecified foreign body in respiratory tract, part unspecified causing other injury, initial encounter: Secondary | ICD-10-CM

## 2019-05-29 DIAGNOSIS — Z79899 Other long term (current) drug therapy: Secondary | ICD-10-CM | POA: Insufficient documentation

## 2019-05-29 DIAGNOSIS — R059 Cough, unspecified: Secondary | ICD-10-CM

## 2019-05-29 DIAGNOSIS — T17308A Unspecified foreign body in larynx causing other injury, initial encounter: Secondary | ICD-10-CM

## 2019-05-29 DIAGNOSIS — I10 Essential (primary) hypertension: Secondary | ICD-10-CM | POA: Diagnosis not present

## 2019-05-29 DIAGNOSIS — Z87891 Personal history of nicotine dependence: Secondary | ICD-10-CM | POA: Diagnosis not present

## 2019-05-29 DIAGNOSIS — R0989 Other specified symptoms and signs involving the circulatory and respiratory systems: Secondary | ICD-10-CM | POA: Insufficient documentation

## 2019-05-29 DIAGNOSIS — R05 Cough: Secondary | ICD-10-CM

## 2019-05-29 MED ORDER — HYDROMORPHONE HCL 1 MG/ML IJ SOLN
0.5000 mg | Freq: Once | INTRAMUSCULAR | Status: AC
  Administered 2019-05-29: 0.5 mg via INTRAVENOUS
  Filled 2019-05-29: qty 1

## 2019-05-29 MED ORDER — OXYCODONE-ACETAMINOPHEN 5-325 MG PO TABS: 1.0000 | ORAL_TABLET | Freq: Four times a day (QID) | ORAL | 0 refills | Status: AC | PRN

## 2019-05-29 MED ORDER — ONDANSETRON HCL 4 MG/2ML IJ SOLN
4.0000 mg | Freq: Once | INTRAMUSCULAR | Status: AC
  Administered 2019-05-29: 4 mg via INTRAVENOUS
  Filled 2019-05-29: qty 2

## 2019-05-29 MED ORDER — SODIUM CHLORIDE 0.9 % IV BOLUS
500.0000 mL | Freq: Once | INTRAVENOUS | Status: AC
  Administered 2019-05-29: 500 mL via INTRAVENOUS

## 2019-05-29 NOTE — Discharge Instructions (Addendum)
Drink plenty of fluids and follow-up with Dr. Oneida Alar the stomach specialist for your your difficulty swallowing and throat problems.  Make sure you keep your other appointments to

## 2019-05-29 NOTE — ED Triage Notes (Signed)
Pt brought in by daughter who states pt just got out of [facility] hospital for multiple complex problems. Choked today on food and is afraid she has aspirated.

## 2019-06-02 NOTE — ED Provider Notes (Signed)
[facility] EMERGENCY DEPARTMENT Provider Note   CSN: [id number] Arrival date & time: 05/29/19  1308     History   Chief Complaint Chief Complaint  Patient presents with  . Choking    HPI Crystal Lopez is a 84 y.o. female.     Patient had episode of choking today but seems to be doing much better she is swallowing liquids now  The history is provided by the patient, medical records and a relative.  Weakness Severity:  Mild Onset quality:  Sudden Timing:  Constant Progression:  Improving Chronicity:  New Context: not alcohol use   Relieved by:  Nothing Worsened by:  Nothing Ineffective treatments:  None tried Associated symptoms: no abdominal pain, no chest pain, no cough, no diarrhea, no frequency, no headaches and no seizures   Risk factors: no excessive menstruation     Past Medical History:  Diagnosis Date  . Anxiety   . Aspirin allergy   . B12 deficiency   . Chest discomfort    Previous negative ischemic workup  . DDD (degenerative disc disease), cervical   . Gait instability   . GERD (gastroesophageal reflux disease)   . Hip fx (HCC)   . Hypertension   . Migraines   . Osteoporosis   . Overactive bladder   . Palpitations   . Restless leg   . Rib pain    March, 2012, injury from a fall  . Sleep apnea    Suspected diagnosis    Patient Active Problem List   Diagnosis Date Noted  . Acute metabolic encephalopathy 12/14/2016  . Lung nodule 12/14/2016  . CAP (community acquired pneumonia) 12/13/2016  . UTI (urinary tract infection) 12/13/2016  . Intractable migraine 03/13/2016  . Sleep apnea   . Ejection fraction   . Aspirin allergy   . Palpitations   . GERD (gastroesophageal reflux disease)   . Chest discomfort   . Shortness of breath     Past Surgical History:  Procedure Laterality Date  . APPENDECTOMY    . EXPLORATORY LAPAROTOMY    . Hysterectomy    . L temple cut       OB History   No obstetric history on file.      Home  Medications    Prior to Admission medications   Medication Sig Start Date End Date Taking? Authorizing Provider  alfuzosin (UROXATRAL) 10 MG 24 hr tablet Take 10 mg by mouth daily with breakfast.    [provider]  amitriptyline (ELAVIL) 10 MG tablet Take 1 tablet (10 mg total) by mouth at bedtime. 03/17/16   Anson Fret, MD  amoxicillin-clavulanate (AUGMENTIN) 875-125 MG tablet Take 1 tablet by mouth 2 (two) times daily. 10 day course starting on 11/10/2018    [provider]  benzonatate (TESSALON) 100 MG capsule Take 100 mg by mouth daily as needed for cough.     [provider]  clonazePAM (KLONOPIN) 2 MG tablet Take 1-2 mg by mouth 2 (two) times daily. Prescribed 1mg  twice daily and 2mg  at bedtime    [provider]  cyanocobalamin (,VITAMIN B-12,) 1000 MCG/ML injection Inject 1,000 mcg into the muscle every 30 (thirty) days.     [provider]  fluticasone (FLONASE) 50 MCG/ACT nasal spray Place 1 spray into both nostrils daily as needed for allergies or rhinitis.    [provider]  gabapentin (NEURONTIN) 100 MG capsule Take 200 mg by mouth at bedtime.     [provider]  HYDROcodone-acetaminophen (NORCO/VICODIN) 5-325 MG tablet 1 tab PO q8 hours prn pain Patient taking differently: Take 0.5-1 tablets by mouth every 8 (eight) hours as needed for moderate pain.  04/04/18   Samuel JesterMcManus, Kathleen, DO  HYDROcodone-homatropine (HYDROMET) 5-1.5 MG/5ML syrup Take 5 mLs by mouth every 6 (six) hours as needed for cough.    [provider]  isosorbide mononitrate (IMDUR) 30 MG 24 hr tablet TAKE 1/2 TABLET(15 MG) BY MOUTH DAILY 04/26/19   Jonelle SidleMcDowell, Samuel G, MD  Magnesium 200 MG TABS Take 400 mg by mouth every evening.     [provider]  metoprolol succinate (TOPROL-XL) 25 MG 24 hr tablet Take 25 mg by mouth daily.    [provider]  nitroGLYCERIN (NITROSTAT) 0.3 MG SL tablet Place 1 tablet (0.3 mg total) under  the tongue every 5 (five) minutes as needed for chest pain. 04/26/18   Jonelle SidleMcDowell, Samuel G, MD  ondansetron (ZOFRAN ODT) 4 MG disintegrating tablet Take 1 tablet (4 mg total) by mouth every 8 (eight) hours as needed for nausea or vomiting. 12/16/16   Philip AspenHernandez Acosta, Limmie PatriciaEstela Y, MD  oseltamivir (TAMIFLU) 75 MG capsule Take 1 capsule (75 mg total) by mouth every 12 (twelve) hours. 11/15/18   Bethann BerkshireZammit, Viriginia Amendola, MD  oxyCODONE-acetaminophen (PERCOCET/ROXICET) 5-325 MG tablet Take 1 tablet by mouth every 6 (six) hours as needed. 05/29/19   Bethann BerkshireZammit, Vollie Aaron, MD  ranitidine (ZANTAC) 150 MG tablet Take 300 mg by mouth daily as needed for heartburn.     [provider]  silodosin (RAPAFLO) 8 MG CAPS capsule Take 8 mg by mouth daily.    [provider]    Family History Family History  Problem Relation Age of Onset  . Heart failure Mother   . Heart disease Father   . Heart failure Father   . Heart disease Sister   . Heart attack Sister   . Heart disease Brother   . Heart attack Brother   . Migraines Neg Hx     Social History Social History   Tobacco Use  . Smoking status: Former Smoker    Packs/day: 2.00    Years: 7.00    Pack years: 14.00    Types: Cigarettes    Quit date: 09/08/1964    Years since quitting: 54.7  . Smokeless tobacco: Never Used  Substance Use Topics  . Alcohol use: No    Alcohol/week: 0.0 standard drinks  . Drug use: No     Allergies   Codeine, Darvocet [propoxyphene n-acetaminophen], Dilaudid [hydromorphone hcl], Morphine and related, Boniva [ibandronate sodium], Depakote er [divalproex sodium er], Doxycycline, Levaquin [levofloxacin], Macrodantin, Prilosec [omeprazole], Ropinirole hcl, Sulfa antibiotics, Tegretol [carbamazepine], Topamax [topiramate], Aspirin, and Erythromycin   Review of Systems Review of Systems  Constitutional: Negative for appetite change and fatigue.  HENT: Negative for congestion, ear discharge and sinus pressure.        Choking  episode  Eyes: Negative for discharge.  Respiratory: Negative for cough.   Cardiovascular: Negative for chest pain.  Gastrointestinal: Negative for abdominal pain and diarrhea.  Genitourinary: Negative for frequency and hematuria.  Musculoskeletal: Negative for back pain.  Skin: Negative for rash.  Neurological: Positive for weakness. Negative for seizures and headaches.  Psychiatric/Behavioral: Negative for hallucinations.     Physical Exam Updated Vital Signs BP (!) 115/50 (BP Location: Right Arm)   Pulse 74   Temp 97.6 F (36.4 C) (Oral)   Resp 16   Ht 4\' 9"  (1.448 m)   Wt 46 kg  SpO2 (!) 6%   BMI 21.95 kg/m   Physical Exam Nursing note reviewed.  Constitutional:      Appearance: She is well-developed.  HENT:     Head: Normocephalic.     Nose: Nose normal.  Eyes:     General: No scleral icterus.    Conjunctiva/sclera: Conjunctivae normal.  Neck:     Musculoskeletal: Neck supple.     Thyroid: No thyromegaly.  Cardiovascular:     Rate and Rhythm: Normal rate and regular rhythm.     Heart sounds: No murmur. No friction rub. No gallop.   Pulmonary:     Breath sounds: No stridor. No wheezing or rales.  Chest:     Chest wall: No tenderness.  Abdominal:     General: There is no distension.     Tenderness: There is no abdominal tenderness. There is no rebound.  Musculoskeletal: Normal range of motion.  Lymphadenopathy:     Cervical: No cervical adenopathy.  Skin:    Findings: No erythema or rash.  Neurological:     Mental Status: She is oriented to person, place, and time.     Motor: No abnormal muscle tone.     Coordination: Coordination normal.  Psychiatric:        Behavior: Behavior normal.      ED Treatments / Results  Labs (all labs ordered are listed, but only abnormal results are displayed) Labs Reviewed - No data to display  EKG None  Radiology No results found.  Procedures Procedures (including critical care time)  Medications Ordered  in ED Medications  HYDROmorphone (DILAUDID) injection 0.5 mg (0.5 mg Intravenous Given 05/29/19 1418)  ondansetron (ZOFRAN) injection 4 mg (4 mg Intravenous Given 05/29/19 1418)  sodium chloride 0.9 % bolus 500 mL (0 mLs Intravenous Stopped 05/29/19 1620)     Initial Impression / Assessment and Plan / ED Course  I have reviewed the triage vital signs and the nursing notes.  Pertinent labs & imaging results that were available during my care of the patient were reviewed by me and considered in my medical decision making (see chart for details).        Given choking episode that is resolved.  She will follow-up with her PCP.  Patient has chronic pain and has run out of pain medicine Final Clinical Impressions(s) / ED Diagnoses   Final diagnoses:  Choking, initial encounter    ED Discharge Orders         Ordered    oxyCODONE-acetaminophen (PERCOCET/ROXICET) 5-325 MG tablet  Every 6 hours PRN     05/29/19 1440           Milton Ferguson, MD 06/02/19 1145

## 2019-06-09 DEATH — deceased

## 2019-11-02 IMAGING — DX DG CHEST 1V
1 series · 1 of 1 positions shown · non-contrast
Comparison: 11/15/2018

CLINICAL DATA: Choked and possible aspiration.

EXAM:
CHEST  1 VIEW

[chest ap]
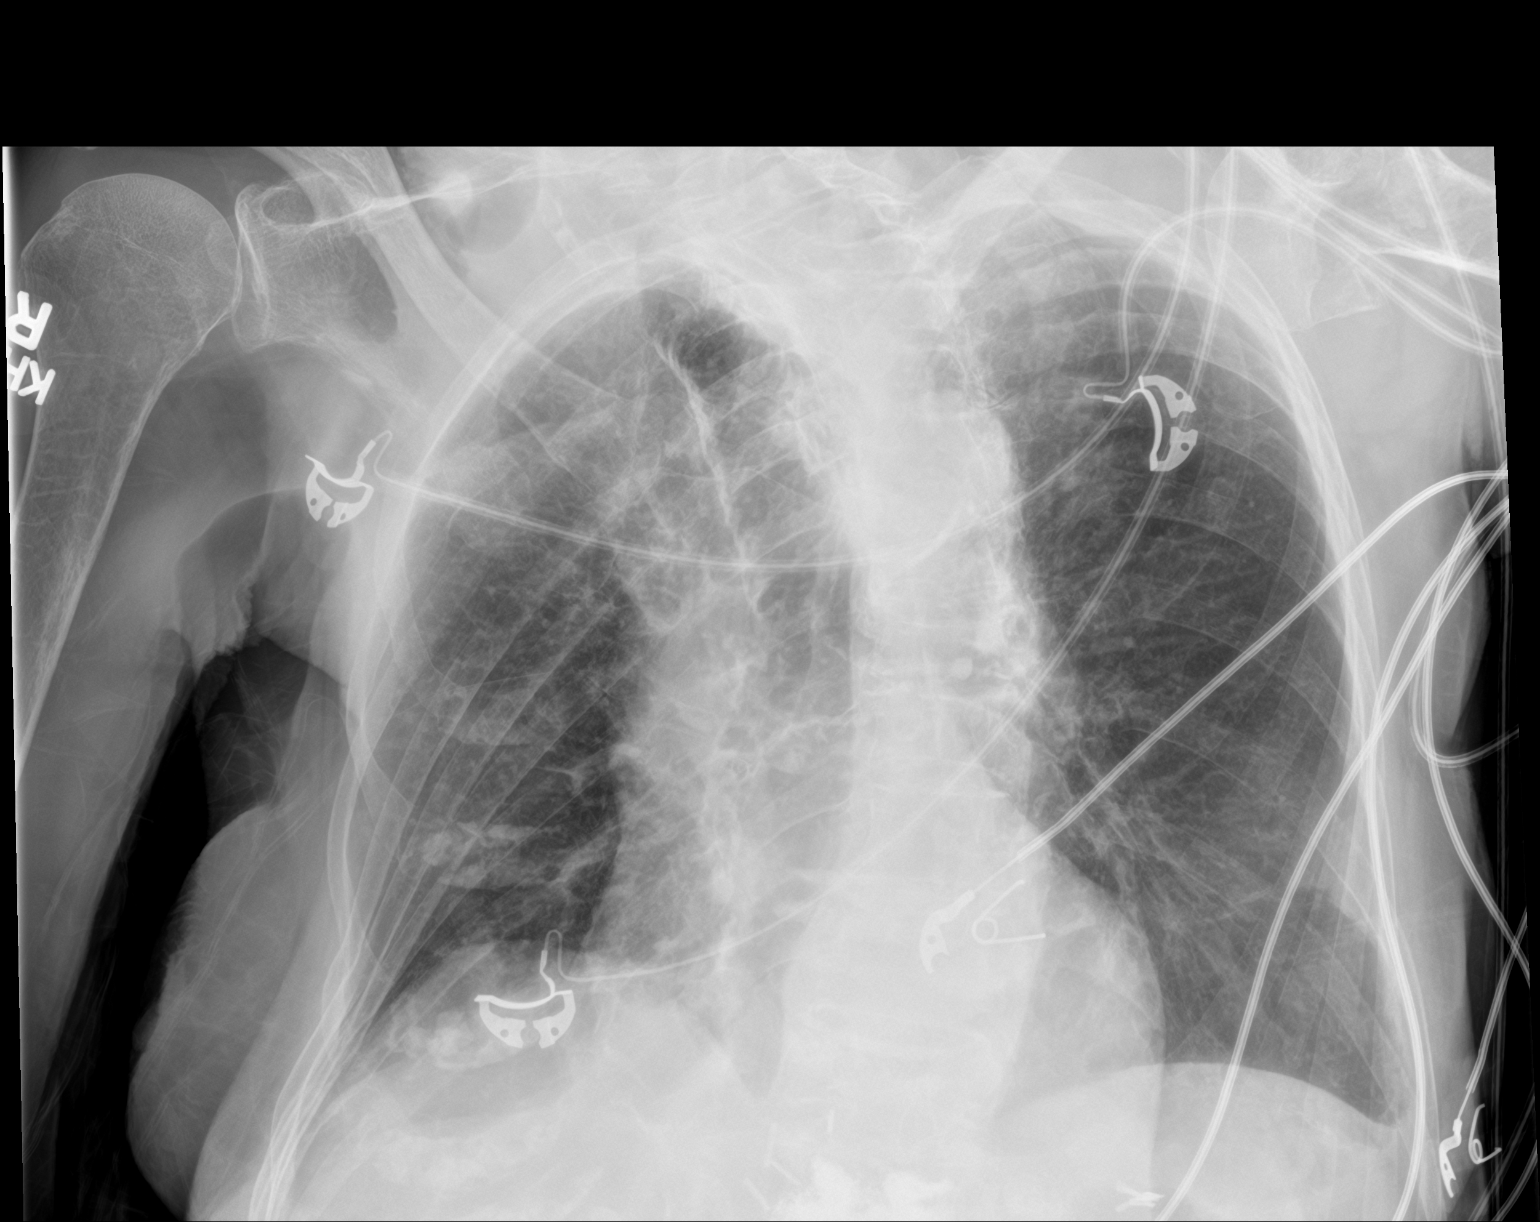

[1 of 1 positions shown; findings below may reference images not displayed]

FINDINGS: Patient is rotated towards the right.

There is no focal consolidation. There is no pleural effusion or
pneumothorax. The heart and mediastinal contours are unremarkable.
There is thoracic aortic atherosclerosis.

There is no acute osseous abnormality.
IMPRESSION: No active disease.
